# Patient Record
Sex: Male | Born: 1935 | Race: White | Hispanic: No | Marital: Single | Smoking: Former smoker
Health system: Southern US, Community
[De-identification: ages and names within clinical notes are randomized; demographics above are authoritative.]

## PROBLEM LIST (undated history)

## (undated) DIAGNOSIS — R456 Violent behavior: Secondary | ICD-10-CM

## (undated) DIAGNOSIS — I447 Left bundle-branch block, unspecified: Secondary | ICD-10-CM

## (undated) DIAGNOSIS — H05012 Cellulitis of left orbit: Secondary | ICD-10-CM

## (undated) DIAGNOSIS — I10 Essential (primary) hypertension: Secondary | ICD-10-CM

## (undated) DIAGNOSIS — J9311 Primary spontaneous pneumothorax: Secondary | ICD-10-CM

## (undated) HISTORY — PX: CYST EXCISION: SHX5701

## (undated) HISTORY — DX: Left bundle-branch block, unspecified: I44.7

---

## 2001-05-04 ENCOUNTER — Emergency Department (HOSPITAL_COMMUNITY): Admission: EM | Admit: 2001-05-04 | Discharge: 2001-05-04 | Payer: Self-pay | Admitting: *Deleted

## 2001-05-05 ENCOUNTER — Emergency Department (HOSPITAL_COMMUNITY): Admission: EM | Admit: 2001-05-05 | Discharge: 2001-05-05 | Payer: Self-pay | Admitting: Emergency Medicine

## 2009-10-04 ENCOUNTER — Ambulatory Visit (HOSPITAL_COMMUNITY): Admission: RE | Admit: 2009-10-04 | Discharge: 2009-10-04 | Payer: Self-pay

## 2010-10-07 ENCOUNTER — Other Ambulatory Visit (HOSPITAL_COMMUNITY): Payer: Self-pay | Admitting: Gastroenterology

## 2010-10-10 ENCOUNTER — Other Ambulatory Visit (HOSPITAL_COMMUNITY): Payer: Self-pay | Admitting: Gastroenterology

## 2010-10-10 DIAGNOSIS — J984 Other disorders of lung: Secondary | ICD-10-CM

## 2010-10-18 ENCOUNTER — Ambulatory Visit (HOSPITAL_COMMUNITY)
Admission: RE | Admit: 2010-10-18 | Discharge: 2010-10-18 | Disposition: A | Discharge status: Home / Self Care | Payer: Medicare Other | Source: Ambulatory Visit | Attending: Gastroenterology | Admitting: Gastroenterology

## 2010-10-18 DIAGNOSIS — J984 Other disorders of lung: Secondary | ICD-10-CM | POA: Insufficient documentation

## 2010-10-18 DIAGNOSIS — N200 Calculus of kidney: Secondary | ICD-10-CM | POA: Insufficient documentation

## 2012-12-17 ENCOUNTER — Ambulatory Visit (INDEPENDENT_AMBULATORY_CARE_PROVIDER_SITE_OTHER): Payer: Medicare Other | Admitting: Cardiovascular Disease

## 2012-12-17 ENCOUNTER — Encounter: Payer: Self-pay | Admitting: Cardiovascular Disease

## 2012-12-17 VITALS — BP 160/80 | HR 78 | Ht 67.0 in | Wt 138.8 lb

## 2012-12-17 DIAGNOSIS — I10 Essential (primary) hypertension: Secondary | ICD-10-CM

## 2012-12-17 DIAGNOSIS — I447 Left bundle-branch block, unspecified: Secondary | ICD-10-CM

## 2012-12-17 NOTE — Patient Instructions (Addendum)
You have been referred to primary care physician group.  Your physician has requested that you have an echocardiogram. Echocardiography is a painless test that uses sound waves to create images of your heart. It provides your doctor with information about the size and shape of your heart and how well your heart's chambers and valves are working. This procedure takes approximately one hour. There are no restrictions for this procedure.  Your physician wants you to follow-up in: 1 year or sooner if needed, You will receive a reminder letter in the mail two months in advance. If you don't receive a letter, please call our office to schedule the follow-up appointment.  REDUCE HIGH SODIUM FOODS LIKE CANNED SOUP, GRAVY, SAUCES, READY PREPARED FOODS LIKE FROZEN FOODS; LEAN CUISINE, LASAGNA. BACON, SAUSAGE, LUNCH MEAT, FAST FOODS, HOT DOGS, CHIPS, PIZZA.   DASH Diet The DASH diet stands for "Dietary Approaches to Stop Hypertension." It is a healthy eating plan that has been shown to reduce high blood pressure (hypertension) in as little as 14 days, while also possibly providing other significant health benefits. These other health benefits include reducing the risk of breast cancer after menopause and reducing the risk of type 2 diabetes, heart disease, colon cancer, and stroke. Health benefits also include weight loss and slowing kidney failure in patients with chronic kidney disease.  DIET GUIDELINES  Limit salt (sodium). Your diet should contain less than 1500 mg of sodium daily.  Limit refined or processed carbohydrates. Your diet should include mostly whole grains. Desserts and added sugars should be used sparingly.  Include small amounts of heart-healthy fats. These types of fats include nuts, oils, and tub margarine. Limit saturated and trans fats. These fats have been shown to be harmful in the body. CHOOSING FOODS  The following food groups are based on a 2000 calorie diet. See your Registered  Dietitian for individual calorie needs. Grains and Grain Products (6 to 8 servings daily)  Eat More Often: Whole-wheat bread, brown rice, whole-grain or wheat pasta, quinoa, popcorn without added fat or salt (air popped).  Eat Less Often: White bread, white pasta, white rice, cornbread. Vegetables (4 to 5 servings daily)  Eat More Often: Fresh, frozen, and canned vegetables. Vegetables may be raw, steamed, roasted, or grilled with a minimal amount of fat.  Eat Less Often/Avoid: Creamed or fried vegetables. Vegetables in a cheese sauce. Fruit (4 to 5 servings daily)  Eat More Often: All fresh, canned (in natural juice), or frozen fruits. Dried fruits without added sugar. One hundred percent fruit juice ( cup [237 mL] daily).  Eat Less Often: Dried fruits with added sugar. Canned fruit in light or heavy syrup. [employer], Fish, and Poultry (2 servings or less daily. One serving is 3 to 4 oz [85-114 g]).  Eat More Often: Ninety percent or leaner ground beef, tenderloin, sirloin. Round cuts of beef, chicken breast, [place] breast. All fish. Grill, bake, or broil your meat. Nothing should be fried.  Eat Less Often/Avoid: Fatty cuts of meat, [place], or chicken leg, thigh, or wing. Fried cuts of meat or fish. Dairy (2 to 3 servings)  Eat More Often: Low-fat or fat-free milk, low-fat plain or light yogurt, reduced-fat or part-skim cheese.  Eat Less Often/Avoid: Milk (whole, 2%).Whole milk yogurt. Full-fat cheeses. Nuts, Seeds, and Legumes (4 to 5 servings per week)  Eat More Often: All without added salt.  Eat Less Often/Avoid: Salted nuts and seeds, canned beans with added salt. Fats and Sweets (limited)  Eat More  Often: Vegetable oils, tub margarines without trans fats, sugar-free gelatin. Mayonnaise and salad dressings.  Eat Less Often/Avoid: Coconut oils, palm oils, butter, stick margarine, cream, half and half, cookies, candy, pie. FOR MORE INFORMATION The Dash Diet Eating Plan:  www.dashdiet.org Document Released: 04/27/2011 Document Revised: 07/31/2011 Document Reviewed: 04/27/2011 [facility] Patient Information 2014 [place], [place].

## 2012-12-17 NOTE — Progress Notes (Signed)
     Lawrence Hicks Date of Birth  04/03/1924       [facility]    [employer] [address], [address], [place], [place]  [address]   [place], [place]  [address] [phone]     [phone]   Fax  [phone]    Fax [phone]  Problem List: 1.  LBBB 2.  Hypertension   History of Present Illness:  [name] is an 79 yo with hx of LBBB and mild HTN.  He was advised by his medical doctor in [place] to have a stress test every 5 years.  He is here to get established and to have his stress test.    Denies any chest pain or dyscomfort.  He does his own shopping and housework.  He has no limitations with his activity.  He   Current Outpatient Prescriptions  Medication Sig Dispense Refill  . ramipril (ALTACE) 2.5 MG capsule 2.5 mg daily.       No current facility-administered medications for this visit.     No Known Allergies  Past Medical History  Diagnosis Date  . LBBB (left bundle branch block)     Past Surgical History  Procedure Laterality Date  . Cyst excision      79 yrs old    History  Smoking status  . Former Smoker  Smokeless tobacco  . Not on file    Comment: 50 yrs ago    History  Alcohol Use: Not on file    No family history on file.  Reviw of Systems:  Reviewed in the HPI.  All other systems are negative.  Physical Exam: Blood pressure 160/80, pulse 78, height 5\' 7"  (1.702 m), weight 138 lb 12.8 oz (62.959 kg). General: Well developed, well nourished, in no acute distress.  Head: Normocephalic, atraumatic, sclera non-icteric, mucus membranes are moist,   Neck: Supple. Carotids are 2 + without bruits. No JVD   Lungs: Clear   Heart: RR, normal S1, S2, reverse splitting of his S2. Abdomen: Soft, non-tender, non-distended with normal bowel sounds.  Msk:  Strength and tone are normal   Extremities: No clubbing or cyanosis. No edema.  Distal pedal pulses are 2+ and equal    Neuro: CN II -  XII intact.  Alert and oriented X 3.   Psych:  Normal   ECG: December 17, 2012:  NSR at 18.  LBBB. LAH.    Assessment / Plan:

## 2012-12-17 NOTE — Assessment & Plan Note (Addendum)
He is completely asymptomatic.   At this point, i would not recommend any further testing in this 79 yo gentleman with no symptoms.  He has no symptoms.  We will get an echo to ensure that his LV function is normal.    His BP is typically normal.  ijt is a bit high today ( running around and stressed today) He gets his ramapril filled by his medical doctor in [place].  I'll see him again in one year for followup office visit, EKG, and fasting labs. Will give him a referral to a primary care doctor here in [place] in case he needs primary care locally.  Scherrie Merritts, MD [address] [place]  [address] [phone] Fax - [phone]

## 2012-12-30 ENCOUNTER — Telehealth: Payer: Self-pay | Admitting: Cardiovascular Disease

## 2012-12-30 NOTE — Telephone Encounter (Signed)
Pt declines having an echo/ order cancelled. He feels he does not need. Tried to explain but pt declined.

## 2012-12-30 NOTE — Telephone Encounter (Signed)
New prob  Pt wants to know why he is having the echo. He said he does not have any heart problems and does not feel it is necessary.

## 2013-01-01 ENCOUNTER — Ambulatory Visit (HOSPITAL_COMMUNITY): Payer: Medicare Other | Attending: Cardiovascular Disease | Admitting: Radiology

## 2013-01-01 ENCOUNTER — Other Ambulatory Visit (HOSPITAL_COMMUNITY): Payer: Medicare Other

## 2013-01-01 DIAGNOSIS — I447 Left bundle-branch block, unspecified: Secondary | ICD-10-CM | POA: Insufficient documentation

## 2013-01-01 DIAGNOSIS — I10 Essential (primary) hypertension: Secondary | ICD-10-CM | POA: Insufficient documentation

## 2013-01-01 DIAGNOSIS — Z87891 Personal history of nicotine dependence: Secondary | ICD-10-CM | POA: Insufficient documentation

## 2013-01-01 NOTE — Progress Notes (Signed)
Echocardiogram performed.  

## 2013-01-02 ENCOUNTER — Telehealth: Payer: Self-pay | Admitting: Cardiovascular Disease

## 2013-01-02 NOTE — Telephone Encounter (Signed)
Echo reviewed.

## 2013-01-02 NOTE — Telephone Encounter (Signed)
F/UP  PT RTNED CALL ON RESULTS

## 2013-01-31 ENCOUNTER — Ambulatory Visit: Payer: Medicare Other | Admitting: Internal Medicine

## 2013-02-10 ENCOUNTER — Other Ambulatory Visit: Payer: Self-pay | Admitting: Rehabilitation

## 2013-02-10 DIAGNOSIS — M545 Low back pain, unspecified: Secondary | ICD-10-CM

## 2013-02-10 DIAGNOSIS — M479 Spondylosis, unspecified: Secondary | ICD-10-CM

## 2013-02-27 ENCOUNTER — Ambulatory Visit
Admission: RE | Admit: 2013-02-27 | Discharge: 2013-02-27 | Disposition: A | Discharge status: Home / Self Care | Payer: Medicare Other | Source: Ambulatory Visit | Attending: Rehabilitation | Admitting: Rehabilitation

## 2013-02-27 DIAGNOSIS — M545 Low back pain: Secondary | ICD-10-CM

## 2013-02-27 DIAGNOSIS — M479 Spondylosis, unspecified: Secondary | ICD-10-CM

## 2013-05-26 DIAGNOSIS — M4120 Other idiopathic scoliosis, site unspecified: Secondary | ICD-10-CM | POA: Diagnosis not present

## 2013-05-26 DIAGNOSIS — M545 Low back pain, unspecified: Secondary | ICD-10-CM | POA: Diagnosis not present

## 2013-05-26 DIAGNOSIS — M47817 Spondylosis without myelopathy or radiculopathy, lumbosacral region: Secondary | ICD-10-CM | POA: Diagnosis not present

## 2013-08-27 DIAGNOSIS — M545 Low back pain, unspecified: Secondary | ICD-10-CM | POA: Diagnosis not present

## 2013-08-27 DIAGNOSIS — M4120 Other idiopathic scoliosis, site unspecified: Secondary | ICD-10-CM | POA: Diagnosis not present

## 2013-08-27 DIAGNOSIS — M48061 Spinal stenosis, lumbar region without neurogenic claudication: Secondary | ICD-10-CM | POA: Diagnosis not present

## 2013-11-25 DIAGNOSIS — H30149 Acute posterior multifocal placoid pigment epitheliopathy, unspecified eye: Secondary | ICD-10-CM | POA: Diagnosis not present

## 2013-11-25 DIAGNOSIS — Z961 Presence of intraocular lens: Secondary | ICD-10-CM | POA: Diagnosis not present

## 2013-11-25 DIAGNOSIS — H18419 Arcus senilis, unspecified eye: Secondary | ICD-10-CM | POA: Diagnosis not present

## 2013-11-25 DIAGNOSIS — I10 Essential (primary) hypertension: Secondary | ICD-10-CM | POA: Diagnosis not present

## 2013-12-17 ENCOUNTER — Ambulatory Visit (INDEPENDENT_AMBULATORY_CARE_PROVIDER_SITE_OTHER): Payer: Medicare Other | Admitting: Cardiovascular Disease

## 2013-12-17 ENCOUNTER — Encounter: Payer: Self-pay | Admitting: Cardiovascular Disease

## 2013-12-17 VITALS — BP 130/70 | HR 77 | Ht 67.0 in | Wt 135.0 lb

## 2013-12-17 DIAGNOSIS — I10 Essential (primary) hypertension: Secondary | ICD-10-CM

## 2013-12-17 DIAGNOSIS — I447 Left bundle-branch block, unspecified: Secondary | ICD-10-CM

## 2013-12-17 NOTE — Progress Notes (Signed)
     Lawrence Hicks Date of Birth  1923-12-20       [facility]    [employer] [address], [place], [place]  [address]   [place], [place]  [address] [phone]     [phone]   Fax  [phone]    Fax [phone]  Problem List: 1.  LBBB 2.  Hypertension   History of Present Illness:  Lawrence Hicks is an 80 yo with hx of LBBB and mild HTN.  He was advised by his medical doctor in [place] to have a stress test every 5 years.  He is here to get established and to have his stress test.    Denies any chest pain or dyscomfort.  He does his own shopping and housework.  He has no limitations with his activity.  He   December 17, 2013:  Lawrence Hicks is doing ok.  No CP or dyspnea.   He needs a primary care MD    Current Outpatient Prescriptions  Medication Sig Dispense Refill  . ramipril (ALTACE) 2.5 MG capsule 2.5 mg daily.       No current facility-administered medications for this visit.     No Known Allergies  Past Medical History  Diagnosis Date  . LBBB (left bundle branch block)     Past Surgical History  Procedure Laterality Date  . Cyst excision      80 yrs old    History  Smoking status  . Former Smoker  Smokeless tobacco  . Not on file    Comment: 50 yrs ago    History  Alcohol Use: Not on file    No family history on file.  Reviw of Systems:  Reviewed in the HPI.  All other systems are negative.  Physical Exam: Blood pressure 130/70, pulse 77, height 5\' 7"  (1.702 m), weight 135 lb (61.236 kg). General: Well developed, well nourished, in no acute distress.  Head: Normocephalic, atraumatic, sclera non-icteric, mucus membranes are moist,   Neck: Supple. Carotids are 2 + without bruits. No JVD   Lungs: Clear   Heart: RR, normal S1, S2, reverse splitting of his S2. Abdomen: Soft, non-tender, non-distended with normal bowel sounds.  Msk:  Strength and tone are normal   Extremities: No clubbing  or cyanosis. No edema.  Distal pedal pulses are 2+ and equal    Neuro: CN II - XII intact.  Alert and oriented X 3.   Psych:  Normal   ECG: December 17, 2013:  NSR at 66.  LBBB. LAH.    Assessment / Plan:

## 2013-12-17 NOTE — Patient Instructions (Addendum)
Primary medical doctors  [facility] Internal medicine  [place]  Rachell Cipro, MD   [facility]   Your physician recommends that you continue on your current medications as directed. Please refer to the Current Medication list given to you today.  Your physician wants you to follow-up in: 1 year with Dr. Acie Fredrickson.  You will receive a reminder letter in the mail two months in advance. If you don't receive a letter, please call our office to schedule the follow-up appointment.

## 2013-12-17 NOTE — Assessment & Plan Note (Signed)
Stable

## 2013-12-17 NOTE — Assessment & Plan Note (Signed)
His blood pressures are low controlled. Continue with his same medications. I will see him  again in one year. He needs to find a general medical Dr.

## 2014-02-28 DIAGNOSIS — Z23 Encounter for immunization: Secondary | ICD-10-CM | POA: Diagnosis not present

## 2014-09-29 DIAGNOSIS — H3531 Nonexudative age-related macular degeneration: Secondary | ICD-10-CM | POA: Diagnosis not present

## 2014-09-29 DIAGNOSIS — H04122 Dry eye syndrome of left lacrimal gland: Secondary | ICD-10-CM | POA: Diagnosis not present

## 2014-09-29 DIAGNOSIS — Z961 Presence of intraocular lens: Secondary | ICD-10-CM | POA: Diagnosis not present

## 2014-09-29 DIAGNOSIS — H02401 Unspecified ptosis of right eyelid: Secondary | ICD-10-CM | POA: Diagnosis not present

## 2014-09-29 DIAGNOSIS — I10 Essential (primary) hypertension: Secondary | ICD-10-CM | POA: Diagnosis not present

## 2015-01-21 ENCOUNTER — Ambulatory Visit (INDEPENDENT_AMBULATORY_CARE_PROVIDER_SITE_OTHER): Payer: Medicare Other | Admitting: Cardiovascular Disease

## 2015-01-21 ENCOUNTER — Encounter: Payer: Self-pay | Admitting: Cardiovascular Disease

## 2015-01-21 VITALS — BP 164/100 | HR 101 | Ht 67.0 in | Wt 140.8 lb

## 2015-01-21 DIAGNOSIS — I10 Essential (primary) hypertension: Secondary | ICD-10-CM

## 2015-01-21 DIAGNOSIS — I447 Left bundle-branch block, unspecified: Secondary | ICD-10-CM

## 2015-01-21 MED ORDER — CARVEDILOL 6.25 MG PO TABS: 6.2500 mg | ORAL_TABLET | Freq: Two times a day (BID) | ORAL | Status: DC

## 2015-01-21 MED ORDER — RAMIPRIL 2.5 MG PO CAPS: 2.5000 mg | ORAL_CAPSULE | Freq: Every day | ORAL | Status: DC
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
## 2015-01-21 NOTE — Patient Instructions (Addendum)
Medication Instructions:  START Coreg (Carvedilol) 6.25 mg twice daily - take 12 hours apart   Labwork: None Ordered   Testing/Procedures: None Ordered   Follow-Up: Your physician recommends that you follow-up with a nurse visit/BP Check in 6 weeks - return on Thursday October 13 at 11:00   Your physician wants you to follow-up in: 6 months with Dr. Acie Fredrickson.  You will receive a reminder letter in the mail two months in advance. If you don't receive a letter, please call our office to schedule the follow-up appointment.

## 2015-01-27 ENCOUNTER — Telehealth: Payer: Self-pay | Admitting: Cardiovascular Disease

## 2015-01-27 NOTE — Telephone Encounter (Signed)
New message     Pt c/o medication issue:  1. Name of Medication: Coreo/carvedilol  2. How are you currently taking this medication (dosage and times per day)? 6.25mg  2 times a day  3. Are you having a reaction (difficulty breathing--STAT)? No  4. What is your medication issue? Nausea; very weak; vision is not as sharp

## 2015-01-27 NOTE — Telephone Encounter (Signed)
Left message for patient to call me back. 

## 2015-01-27 NOTE — Telephone Encounter (Signed)
Attempted to contact patient again; no answer Left message to call the office

## 2015-01-28 ENCOUNTER — Telehealth: Payer: Self-pay

## 2015-01-28 NOTE — Telephone Encounter (Signed)
Patient is waiting on a call back. He said he will wait before going to go food with his neighbor. He is dizzy and weak.

## 2015-01-28 NOTE — Telephone Encounter (Signed)
Attempted to call patient, phone rang numerous times - no answer.  Will call back in a few mintues

## 2015-01-28 NOTE — Telephone Encounter (Signed)
Spoke with patient.  He states he has been feeling weak and dizzy since starting Carvedilol a few weeks ago.  I discussed the dizziness with him and advised that we can cut the dose in half as the dizziness is especially concerning with his advanced age and due to the fact that he lives alone.  The patient states the symptom that is most concerning to him is that he feels weak; he states he does not notice that he is "swimmy-headed."  He just started the carvedilol yesterday.  I advised him that it will take him 1-2 weeks to adjust to the effects of the beta blocker and that weakness is a known side effect.  I asked him again about the dizziness and he says that he gets around his home fine with the help of his walking stick and does not feel like he is going to fall down.  He states he does not go out of the house without assistance.  He states he would like to stay on the medication a little longer to see how he feels.  I advised him that he may continue but to call me if the dizziness worsens and that we can cut the medication dose in half.  I advised that if he does not have dizziness to call me in 1 week to report how he is feeling.

## 2015-01-29 ENCOUNTER — Telehealth: Payer: Self-pay | Admitting: Nurse Practitioner

## 2015-01-29 NOTE — Telephone Encounter (Signed)
Agree with the plan to cut back on the Coreg  Will touch base with him next week

## 2015-01-29 NOTE — Telephone Encounter (Signed)
Spoke with patient who states he did not feel well last night or this morning after taking Carvedilol 6.25 mg.  I advised patient to cut dose in 1/2 and take 3.125 mg twice daily.  He states he has someone taking him grocery shopping tomorrow and he would like to start the lower dose tomorrow night.  He will hold Carvedilol tonight and tomorrow morning due to feeling "wobbly."  I advised him that he may hold medication until tomorrow night and then start 3.125 mg dose.  I advised him to call me next week to let me know how he is feeling.  He verified that he will continue Ramipril 2.5 mg and he verbalized understanding and agreement with plan of care.

## 2015-02-04 ENCOUNTER — Telehealth: Payer: Self-pay | Admitting: Nurse Practitioner

## 2015-02-04 NOTE — Telephone Encounter (Signed)
Agree with medication changes and the note by Christen Bame, RN

## 2015-02-04 NOTE — Telephone Encounter (Signed)
Attempted to call patient to check on him since decreasing his dose of Carvedilol on 9/9.  I left a message for patient to call me back

## 2015-02-04 NOTE — Telephone Encounter (Signed)
Spoke with patient who states he has stopped taking the Carvedilol due to "wobbly legs" and inability to ambulate well even with his cane.  He states he thinks the medicine is too strong for him.  He is scheduled for BP check/nurse visit on 10/13.  I advised him to continue to hold medication until that visit.  He verbalized understanding and agreement.

## 2015-03-04 ENCOUNTER — Ambulatory Visit (INDEPENDENT_AMBULATORY_CARE_PROVIDER_SITE_OTHER): Payer: Medicare Other | Admitting: *Deleted

## 2015-03-04 VITALS — BP 138/74 | HR 62 | Wt 140.0 lb

## 2015-03-04 DIAGNOSIS — I10 Essential (primary) hypertension: Secondary | ICD-10-CM

## 2015-03-04 NOTE — Progress Notes (Signed)
1.) Reason for visit: BP Check  2.) Name of MD requesting visit: Dr. Acie Fredrickson  3.) ROS related to problem: Pt in today for BP check.  Had pt rest for 10 minutes after walking from lobby.  BP 138/74, HR 62.  Pt denies dizziness, lightheadedness, CP, headache, N/V or SOB.  Pt states that when on the Coreg he had an episode of blurry vision and a "wobbly" feeling.  Pt states that he has not taken Coreg at all since [place] told him to stop it and he has felt fine.    4.) Assessment and plan per MD: Advised pt that I will send information to Dr. Acie Fredrickson and his nurse and someone from our office will call him with any recommendations from Dr. Acie Fredrickson.  Pt verbalized understanding and was very appreciative for visit today.

## 2015-03-08 NOTE — Progress Notes (Signed)
Agree with holding Coreg since he was having symptoms while on the Coreg and the unsteadiness feeling has resolved now that he has DC'd the coreg.

## 2015-03-14 DIAGNOSIS — Z23 Encounter for immunization: Secondary | ICD-10-CM | POA: Diagnosis not present

## 2015-07-20 ENCOUNTER — Encounter: Payer: Self-pay | Admitting: Cardiovascular Disease

## 2015-07-20 ENCOUNTER — Ambulatory Visit (INDEPENDENT_AMBULATORY_CARE_PROVIDER_SITE_OTHER): Payer: Medicare Other | Admitting: Cardiovascular Disease

## 2015-07-20 VITALS — BP 130/75 | HR 65 | Ht 67.0 in | Wt 141.2 lb

## 2015-07-20 DIAGNOSIS — Z13220 Encounter for screening for lipoid disorders: Secondary | ICD-10-CM

## 2015-07-20 DIAGNOSIS — I10 Essential (primary) hypertension: Secondary | ICD-10-CM | POA: Diagnosis not present

## 2015-07-20 LAB — CBC WITH DIFFERENTIAL/PLATELET
BASOS ABS: 0 10*3/uL (ref 0.0–0.1)
Basophils Relative: 0 % (ref 0–1)
EOS ABS: 0 10*3/uL (ref 0.0–0.7)
Eosinophils Relative: 0 % (ref 0–5)
HCT: 40.6 % (ref 39.0–52.0)
Hemoglobin: 13.8 g/dL (ref 13.0–17.0)
LYMPHS ABS: 1.5 10*3/uL (ref 0.7–4.0)
LYMPHS PCT: 15 % (ref 12–46)
MCH: 30.4 pg (ref 26.0–34.0)
MCHC: 34 g/dL (ref 30.0–36.0)
MCV: 89.4 fL (ref 78.0–100.0)
MPV: 11.2 fL (ref 8.6–12.4)
Monocytes Absolute: 0.8 10*3/uL (ref 0.1–1.0)
Monocytes Relative: 8 % (ref 3–12)
NEUTROS PCT: 77 % (ref 43–77)
Neutro Abs: 7.5 10*3/uL (ref 1.7–7.7)
PLATELETS: 234 10*3/uL (ref 150–400)
RBC: 4.54 MIL/uL (ref 4.22–5.81)
RDW: 13.8 % (ref 11.5–15.5)
WBC: 9.7 10*3/uL (ref 4.0–10.5)

## 2015-07-20 MED ORDER — RAMIPRIL 2.5 MG PO CAPS: 2.5000 mg | ORAL_CAPSULE | Freq: Every day | ORAL | Status: DC

## 2015-07-20 NOTE — Patient Instructions (Addendum)
Medication Instructions:  Your physician recommends that you continue on your current medications as directed. Please refer to the Current Medication list given to you today.   Labwork: TODAY - CBC, complete metabolic panel, cholesterol   Testing/Procedures: None Ordered   Follow-Up: Your physician wants you to follow-up in: 1 year with Dr. Acie Fredrickson.  You will receive a reminder letter in the mail two months in advance. If you don't receive a letter, please call our office to schedule the follow-up appointment.   If you need a refill on your cardiac medications before your next appointment, please call your pharmacy.   Thank you for choosing [facility]! Christen Bame, RN [phone]
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
## 2015-07-21 LAB — COMPREHENSIVE METABOLIC PANEL
ALK PHOS: 105 U/L (ref 40–115)
ALT: 12 U/L (ref 9–46)
AST: 17 U/L (ref 10–35)
Albumin: 4.2 g/dL (ref 3.6–5.1)
BILIRUBIN TOTAL: 0.5 mg/dL (ref 0.2–1.2)
BUN: 25 mg/dL (ref 7–25)
CO2: 23 mmol/L (ref 20–31)
CREATININE: 0.98 mg/dL (ref 0.70–1.11)
Calcium: 9.5 mg/dL (ref 8.6–10.3)
Chloride: 102 mmol/L (ref 98–110)
Glucose, Bld: 104 mg/dL — ABNORMAL HIGH (ref 65–99)
POTASSIUM: 4.6 mmol/L (ref 3.5–5.3)
Sodium: 139 mmol/L (ref 135–146)
TOTAL PROTEIN: 6.8 g/dL (ref 6.1–8.1)

## 2015-07-21 LAB — LIPID PANEL
CHOLESTEROL: 157 mg/dL (ref 125–200)
HDL: 61 mg/dL (ref >=40)
LDL Cholesterol: 77 mg/dL (ref <130)
Total CHOL/HDL Ratio: 2.6 Ratio (ref <=5.0)
Triglycerides: 93 mg/dL (ref <150)
VLDL: 19 mg/dL (ref <30)

## 2015-07-24 ENCOUNTER — Inpatient Hospital Stay (HOSPITAL_COMMUNITY)
Admission: EM | Admit: 2015-07-24 | Discharge: 2015-07-27 | DRG: 200 | Disposition: A | Discharge status: Home / Self Care | Payer: Medicare Other | Attending: Internal Medicine | Admitting: Internal Medicine

## 2015-07-24 ENCOUNTER — Inpatient Hospital Stay (HOSPITAL_COMMUNITY): Payer: Medicare Other

## 2015-07-24 ENCOUNTER — Emergency Department (HOSPITAL_COMMUNITY): Payer: Medicare Other

## 2015-07-24 DIAGNOSIS — R05 Cough: Secondary | ICD-10-CM

## 2015-07-24 DIAGNOSIS — M549 Dorsalgia, unspecified: Secondary | ICD-10-CM | POA: Diagnosis not present

## 2015-07-24 DIAGNOSIS — R410 Disorientation, unspecified: Secondary | ICD-10-CM | POA: Diagnosis not present

## 2015-07-24 DIAGNOSIS — I447 Left bundle-branch block, unspecified: Secondary | ICD-10-CM | POA: Diagnosis present

## 2015-07-24 DIAGNOSIS — Z87891 Personal history of nicotine dependence: Secondary | ICD-10-CM | POA: Diagnosis not present

## 2015-07-24 DIAGNOSIS — J9819 Other pulmonary collapse: Secondary | ICD-10-CM | POA: Diagnosis present

## 2015-07-24 DIAGNOSIS — Z79899 Other long term (current) drug therapy: Secondary | ICD-10-CM

## 2015-07-24 DIAGNOSIS — I10 Essential (primary) hypertension: Secondary | ICD-10-CM | POA: Diagnosis not present

## 2015-07-24 DIAGNOSIS — R069 Unspecified abnormalities of breathing: Secondary | ICD-10-CM | POA: Diagnosis not present

## 2015-07-24 DIAGNOSIS — Z9689 Presence of other specified functional implants: Secondary | ICD-10-CM | POA: Insufficient documentation

## 2015-07-24 DIAGNOSIS — R0902 Hypoxemia: Secondary | ICD-10-CM | POA: Insufficient documentation

## 2015-07-24 DIAGNOSIS — J069 Acute upper respiratory infection, unspecified: Secondary | ICD-10-CM | POA: Insufficient documentation

## 2015-07-24 DIAGNOSIS — R059 Cough, unspecified: Secondary | ICD-10-CM | POA: Insufficient documentation

## 2015-07-24 DIAGNOSIS — J939 Pneumothorax, unspecified: Secondary | ICD-10-CM | POA: Insufficient documentation

## 2015-07-24 DIAGNOSIS — Z4682 Encounter for fitting and adjustment of non-vascular catheter: Secondary | ICD-10-CM | POA: Diagnosis not present

## 2015-07-24 DIAGNOSIS — J9383 Other pneumothorax: Secondary | ICD-10-CM | POA: Diagnosis not present

## 2015-07-24 DIAGNOSIS — Z789 Other specified health status: Secondary | ICD-10-CM | POA: Diagnosis not present

## 2015-07-24 DIAGNOSIS — Z8679 Personal history of other diseases of the circulatory system: Secondary | ICD-10-CM | POA: Diagnosis not present

## 2015-07-24 DIAGNOSIS — R0602 Shortness of breath: Secondary | ICD-10-CM | POA: Diagnosis not present

## 2015-07-24 DIAGNOSIS — J9811 Atelectasis: Secondary | ICD-10-CM | POA: Diagnosis not present

## 2015-07-24 LAB — CBC
HCT: 35.6 % — ABNORMAL LOW (ref 39.0–52.0)
HEMATOCRIT: 39.4 % (ref 39.0–52.0)
HEMOGLOBIN: 13.4 g/dL (ref 13.0–17.0)
Hemoglobin: 11.6 g/dL — ABNORMAL LOW (ref 13.0–17.0)
MCH: 31.1 pg (ref 26.0–34.0)
MCH: 29.8 pg (ref 26.0–34.0)
MCHC: 34 g/dL (ref 30.0–36.0)
MCHC: 32.6 g/dL (ref 30.0–36.0)
MCV: 91.4 fL (ref 78.0–100.0)
MCV: 91.5 fL (ref 78.0–100.0)
PLATELETS: 180 10*3/uL (ref 150–400)
Platelets: 181 10*3/uL (ref 150–400)
RBC: 4.31 MIL/uL (ref 4.22–5.81)
RBC: 3.89 MIL/uL — AB (ref 4.22–5.81)
RDW: 14.1 % (ref 11.5–15.5)
RDW: 14 % (ref 11.5–15.5)
WBC: 6.6 10*3/uL (ref 4.0–10.5)
WBC: 9.2 10*3/uL (ref 4.0–10.5)

## 2015-07-24 LAB — I-STAT CG4 LACTIC ACID, ED: Lactic Acid, Venous: 1.58 mmol/L (ref 0.5–2.0)

## 2015-07-24 LAB — BASIC METABOLIC PANEL
Anion gap: 12 (ref 5–15)
BUN: 27 mg/dL — AB (ref 6–20)
CALCIUM: 9 mg/dL (ref 8.9–10.3)
CHLORIDE: 107 mmol/L (ref 101–111)
CO2: 21 mmol/L — AB (ref 22–32)
CREATININE: 1.07 mg/dL (ref 0.61–1.24)
GFR calc Af Amer: >60 mL/min (ref >60)
GFR calc non Af Amer: 59 mL/min — ABNORMAL LOW (ref >60)
Glucose, Bld: 108 mg/dL — ABNORMAL HIGH (ref 65–99)
Potassium: 4.3 mmol/L (ref 3.5–5.1)
Sodium: 140 mmol/L (ref 135–145)

## 2015-07-24 LAB — I-STAT TROPONIN, ED: TROPONIN I, POC: 0 ng/mL (ref 0.00–0.08)

## 2015-07-24 LAB — BRAIN NATRIURETIC PEPTIDE: B Natriuretic Peptide: 26.3 pg/mL (ref 0.0–100.0)

## 2015-07-24 LAB — INFLUENZA PANEL BY PCR (TYPE A & B)
H1N1 flu by pcr: NOT DETECTED
Influenza A By PCR: NEGATIVE
Influenza B By PCR: NEGATIVE

## 2015-07-24 MED ORDER — ACETAMINOPHEN 325 MG PO TABS
650.0000 mg | ORAL_TABLET | Freq: Four times a day (QID) | ORAL | Status: DC | PRN
Start: 2015-07-24 — End: 2015-07-27
  Administered 2015-07-24: 650 mg via ORAL
  Filled 2015-07-24: qty 2

## 2015-07-24 MED ORDER — FLUTICASONE PROPIONATE 50 MCG/ACT NA SUSP: 2.0000 | Freq: Every day | NASAL | Status: DC

## 2015-07-24 MED ORDER — CROMOLYN SODIUM 5.2 MG/ACT NA AERS
1.0000 | INHALATION_SPRAY | Freq: Four times a day (QID) | NASAL | Status: DC
  Filled 2015-07-24: qty 13

## 2015-07-24 MED ORDER — DEXTROMETHORPHAN POLISTIREX ER 30 MG/5ML PO SUER
30.0000 mg | Freq: Two times a day (BID) | ORAL | Status: DC | PRN
  Filled 2015-07-24: qty 5

## 2015-07-24 MED ORDER — POLYETHYLENE GLYCOL 3350 17 G PO PACK: 17.0000 g | PACK | Freq: Every day | ORAL | Status: DC | PRN

## 2015-07-24 MED ORDER — HYDROCODONE-ACETAMINOPHEN 5-325 MG PO TABS: 1.0000 | ORAL_TABLET | ORAL | Status: DC | PRN

## 2015-07-24 MED ORDER — HYDROMORPHONE HCL 1 MG/ML IJ SOLN: 0.2500 mg | INTRAMUSCULAR | Status: DC | PRN

## 2015-07-24 MED ORDER — FLUTICASONE PROPIONATE 50 MCG/ACT NA SUSP
2.0000 | Freq: Every day | NASAL | Status: DC
  Administered 2015-07-24 – 2015-07-27 (×4): 2 via NASAL
  Filled 2015-07-24: qty 16

## 2015-07-24 MED ORDER — LIDOCAINE-EPINEPHRINE (PF) 2 %-1:200000 IJ SOLN
20.0000 mL | Freq: Once | INTRAMUSCULAR | Status: AC
  Administered 2015-07-24: 20 mL
  Filled 2015-07-24: qty 20

## 2015-07-24 MED ORDER — SODIUM CHLORIDE 0.9% FLUSH
3.0000 mL | Freq: Two times a day (BID) | INTRAVENOUS | Status: DC
Start: 2015-07-24 — End: 2015-07-27
  Administered 2015-07-24 – 2015-07-26 (×6): 3 mL via INTRAVENOUS

## 2015-07-24 MED ORDER — ENOXAPARIN SODIUM 40 MG/0.4ML ~~LOC~~ SOLN
40.0000 mg | SUBCUTANEOUS | Status: DC
Start: 2015-07-24 — End: 2015-07-24
  Administered 2015-07-24: 40 mg via SUBCUTANEOUS
  Filled 2015-07-24: qty 0.4

## 2015-07-24 MED ORDER — ACETAMINOPHEN 650 MG RE SUPP: 650.0000 mg | Freq: Four times a day (QID) | RECTAL | Status: DC | PRN

## 2015-07-24 NOTE — ED Provider Notes (Addendum)
CSN: [id number]     Arrival date & time 07/24/15  0503 History   First MD Initiated Contact with Patient 07/24/15 [phone]     Chief Complaint  Patient presents with  . Shortness of Breath     (Consider location/radiation/quality/duration/timing/severity/associated sxs/prior Treatment) Patient is a 82 y.o. male presenting with shortness of breath. The history is provided by the patient and the EMS personnel.  Shortness of Breath Associated symptoms: cough   Associated symptoms: no abdominal pain, no chest pain, no fever, no headaches, no neck pain, no rash, no sore throat and no vomiting   Patient w hx htn, present with sob. Pt also notes coughing for the past 2 weeks. Nasal congestion. No fever or chills. Sob onset in past 1-2 weeks. Worse tonight. Moderate-severe. Worse w coughing spells.  +nasal congestion. No sore throat. No known ill contacts. Denies recent abx use.      Past Medical History  Diagnosis Date  . LBBB (left bundle branch block)    Past Surgical History  Procedure Laterality Date  . Cyst excision      82 yrs old   Family History  Problem Relation Age of Onset  . Family history unknown: Yes   Social History  Substance Use Topics  . Smoking status: Former Research scientist (life sciences)  . Smokeless tobacco: Not on file     Comment: 50 yrs ago  . Alcohol Use: Not on file    Review of Systems  Constitutional: Negative for fever and chills.  HENT: Negative for sore throat.   Eyes: Negative for discharge and redness.  Respiratory: Positive for cough and shortness of breath.   Cardiovascular: Negative for chest pain and leg swelling.  Gastrointestinal: Negative for vomiting, abdominal pain and diarrhea.  Endocrine: Negative for polyuria.  Genitourinary: Negative for dysuria and flank pain.  Musculoskeletal: Negative for back pain and neck pain.  Skin: Negative for rash.  Neurological: Negative for headaches.  Hematological: Does not bruise/bleed easily.  Psychiatric/Behavioral:  Negative for confusion.      Allergies  Coreg  Home Medications   Prior to Admission medications   Medication Sig Start Date End Date Taking? Authorizing Provider  carvedilol (COREG) 6.25 MG tablet Take 1 tablet (6.25 mg total) by mouth 2 (two) times daily. Patient not taking: Reported on 03/04/2015 01/21/15   Thayer Headings, MD  ramipril (ALTACE) 2.5 MG capsule Take 1 capsule (2.5 mg total) by mouth daily. 07/20/15   Thayer Headings, MD   BP 164/76 mmHg  Pulse 84  Temp(Src) 97.5 F (36.4 C) (Oral)  Resp 27  Ht 5\' 7"  (1.702 m)  Wt 68.04 kg  BMI 23.49 kg/m2 Physical Exam  Constitutional: He is oriented to person, place, and time. He appears well-developed and well-nourished. No distress.  HENT:  Head: Atraumatic.  Mouth/Throat: Oropharynx is clear and moist.  Eyes: Conjunctivae are normal. No scleral icterus.  Neck: Neck supple. No tracheal deviation present.  No stiffness or rigidity  Cardiovascular: Normal rate, regular rhythm, normal heart sounds and intact distal pulses.  Exam reveals no gallop and no friction rub.   No murmur heard. Pulmonary/Chest: Effort normal. No accessory muscle usage. No respiratory distress.  Coughing. Decreased bs left. Trach midline. Upper resp congestion.   Abdominal: Soft. Bowel sounds are normal. He exhibits no distension. There is no tenderness.  Musculoskeletal: Normal range of motion. He exhibits no edema or tenderness.  Neurological: He is alert and oriented to person, place, and time.  Skin: Skin is warm  and dry. No rash noted. He is not diaphoretic.  Psychiatric: He has a normal mood and affect.  Nursing note and vitals reviewed.   ED Course  CHEST TUBE INSERTION Date/Time: 07/24/2015 7:19 AM Performed by: Lajean Saver Authorized by: Lajean Saver Consent: The procedure was performed in an emergent situation. Verbal consent obtained. Written consent obtained. Risks and benefits: risks, benefits and alternatives were  discussed Consent given by: patient Patient understanding: patient states understanding of the procedure being performed Patient consent: the patient's understanding of the procedure matches consent given Procedure consent: procedure consent matches procedure scheduled Relevant documents: relevant documents present and verified Test results: test results available and properly labeled Site marked: the operative site was marked Imaging studies: imaging studies available Patient identity confirmed: verbally with patient and arm band Time out: Immediately prior to procedure a "time out" was called to verify the correct patient, procedure, equipment, support staff and site/side marked as required. Indications: pneumothorax Patient sedated: no Anesthesia: local infiltration Local anesthetic: lidocaine 2% with epinephrine Anesthetic total: 12 ml Preparation: skin prepped with Betadine, skin prepped with ChloraPrep and sterile field established Placement location: left lateral Scalpel size: 11 Tube size: 20 [place] Dissection instrument: Kelly clamp Tube connected to: suction Suture material: 0 silk Dressing: Xeroform gauze Post-insertion x-ray findings: tube in good position Patient tolerance: Patient tolerated the procedure well with no immediate complications   (including critical care time) Labs Review  Results for orders placed or performed during the hospital encounter of [id number]  Basic metabolic panel  Result Value Ref Range   Sodium 140 135 - 145 mmol/L   Potassium 4.3 3.5 - 5.1 mmol/L   Chloride 107 101 - 111 mmol/L   CO2 21 (L) 22 - 32 mmol/L   Glucose, Bld 108 (H) 65 - 99 mg/dL   BUN 27 (H) 6 - 20 mg/dL   Creatinine, Ser 1.07 0.61 - 1.24 mg/dL   Calcium 9.0 8.9 - 10.3 mg/dL   GFR calc non Af Amer 59 (L) >60 mL/min   GFR calc Af Amer >60 >60 mL/min   Anion gap 12 5 - 15  CBC  Result Value Ref Range   WBC 6.6 4.0 - 10.5 K/uL   RBC 4.31 4.22 - 5.81 MIL/uL   Hemoglobin  13.4 13.0 - 17.0 g/dL   HCT 39.4 39.0 - 52.0 %   MCV 91.4 78.0 - 100.0 fL   MCH 31.1 26.0 - 34.0 pg   MCHC 34.0 30.0 - 36.0 g/dL   RDW 14.1 11.5 - 15.5 %   Platelets 181 150 - 400 K/uL  I-stat troponin, ED (not at [facility])  Result Value Ref Range   Troponin i, poc 0.00 0.00 - 0.08 ng/mL   Comment 3          I-Stat CG4 Lactic Acid, ED  Result Value Ref Range   Lactic Acid, Venous 1.58 0.5 - 2.0 mmol/L   Dg Chest 2 View  07/24/2015  CLINICAL DATA:  Acute onset of left upper back pain and shortness of breath. Initial encounter. EXAM: CHEST  2 VIEW COMPARISON:  CT of the chest performed 10/18/2010 FINDINGS: There is complete collapse of the left lung, with a left-sided pneumothorax involving the entire left hemithorax. No significant mediastinal shift is seen. Mild right basilar atelectasis is noted. No significant pleural effusion is identified. The cardiomediastinal silhouette is normal in size. No acute osseous abnormalities are seen. Degenerative change is noted at the left glenohumeral joint, with chronic remodeling humeral  head. IMPRESSION: Complete collapse of the left lung, with a left-sided pneumothorax involving the entire left hemithorax. Mild right basilar atelectasis noted. Critical Value/emergent results were called by telephone at the time of interpretation on 07/24/2015 at 6:10 am to Dr. Lajean Saver, who verbally acknowledged these results. Electronically Signed   By: Garald Balding M.D.   On: 07/24/2015 06:12      I have personally reviewed and evaluated these images and lab results as part of my medical decision-making.   EKG Interpretation   Date/Time:  Saturday July 24 2015 05:05:59 EST Ventricular Rate:  77 PR Interval:  196 QRS Duration: 140 QT Interval:  440 QTC Calculation: 497 R Axis:   -101 Text Interpretation:  Normal sinus rhythm Left axis deviation Left bundle  branch block No significant change since last tracing Confirmed by Hipolito Martinezlopez   MD, Christofer Shen ([id number]) on  07/24/2015 5:26:23 AM      MDM   Iv ns. Continuous pulse ox and monitor.   Cxr. Labs. Ecg.   Initially pt very hypoxic per EMS, o2 sats 70's, placed on 100% mask.   Pt continues with labored breathing. Dyspneic. Coughing, non prod.  Reviewed nursing notes and prior charts for additional history.   Large ptx on cxr. Will place chest tube - see note above.   Patients resp status improved post chest tube insertion.  Given recurrent cough, nasal congestion, subj fever, will add flu screen to workup.  CRITICAL CARE  RE acute respiratory distress, severe hypoxia, large/complete left pneumothorax,  Performed by: Mirna Mires Total critical care time: 35 minutes Critical care time was exclusive of separately billable procedures and treating other patients. Critical care was necessary to treat or prevent imminent or life-threatening deterioration. Critical care was time spent personally by me on the following activities: development of treatment plan with patient and/or surrogate as well as nursing, discussions with consultants, evaluation of patient's response to treatment, examination of patient, obtaining history from patient or surrogate, ordering and performing treatments and interventions, ordering and review of laboratory studies, ordering and review of radiographic studies, pulse oximetry and re-evaluation of patient's condition.  Pt indicates has no pcp - internal medicine resident paged, discussed pt, as an unassigned admission.  They will admit, requests pulm cnosult re management chest tube.  pulm consulted.      Lajean Saver, MD 07/24/15 [phone]

## 2015-07-24 NOTE — Consult Note (Signed)
Name: Lawrence Hicks MRN: [id number] DOB: Aug 02, 1923    ADMISSION DATE:  07/24/2015 CONSULTATION DATE:  3/4  REFERRING MD :  IMTS   CHIEF COMPLAINT:  ptx   BRIEF PATIENT DESCRIPTION: 82yo male with hx HTN, LBBB presented 3/4 with 2 week hx cough and congestion and new onset L sided back pain and SOB which woke him at 3am.  He was hypoxic on admit with sats 70's on RA.  CXR revealed large L ptx and chest tube was placed in ER.  PCCM asked to assist with chest tube management.   SIGNIFICANT EVENTS  3/4>>admit, large L ptx, CT placed in ER  STUDIES:     HISTORY OF PRESENT ILLNESS: 82yo male with hx HTN, LBBB presented 3/4 with 2 week hx cough and congestion and new onset L sided back pain and SOB which woke him at 3am.  He was hypoxic on admit with sats 70's on RA.  CXR revealed large L ptx and chest tube was placed in ER.  PCCM asked to assist with chest tube management.   Currently feeling much better.  Denies chest pain, dyspnea, just mild pain at CT site.    PAST MEDICAL HISTORY :   has a past medical history of LBBB (left bundle branch block).  has past surgical history that includes Cyst excision. Prior to Admission medications   Medication Sig Start Date End Date Taking? Authorizing Provider  acetaminophen (TYLENOL) 325 MG tablet Take 650 mg by mouth every 6 (six) hours as needed for mild pain.   Yes Historical Provider, MD  ramipril (ALTACE) 2.5 MG capsule Take 1 capsule (2.5 mg total) by mouth daily. 07/20/15  Yes Thayer Headings, MD   Allergies  Allergen Reactions  . Coreg [Carvedilol] Other (See Comments)    Weakness     FAMILY HISTORY:  Family history is unknown by patient. SOCIAL HISTORY:  reports that he has quit smoking. He does not have any smokeless tobacco history on file.  REVIEW OF SYSTEMS:   As per HPI - All other systems reviewed and were neg.    SUBJECTIVE:   VITAL SIGNS: Temp:  [97.5 F (36.4 C)-98.9 F (37.2 C)] 98.9 F (37.2 C) (03/04  1046) Pulse Rate:  [63-92] 92 (03/04 1046) Resp:  [15-27] 16 (03/04 1046) BP: (99-173)/(59-96) 119/71 mmHg (03/04 1046) SpO2:  [91 %-100 %] 97 % (03/04 1046) Weight:  [68.04 kg (150 lb)] 68.04 kg (150 lb) (03/04 0514)  PHYSICAL EXAMINATION: General:  Pleasant elderly male, NAD  Neuro:  Awake, alert, appropriate, MAE HEENT:  Mm moist, no JVD  Cardiovascular:  s1s2 rrr Lungs:  resps even non labored on RA, L chest tube, dressing saturated (RN coming to change), no air leak  Abdomen:  Soft, +bs  Musculoskeletal:  Warm and dry, no edema   Recent Labs Lab 07/20/15 1130 07/24/15 0520  NA 139 140  K 4.6 4.3  CL 102 107  CO2 23 21*  BUN 25 27*  CREATININE 0.98 1.07  GLUCOSE 104* 108*    Recent Labs Lab 07/20/15 1130 07/24/15 0520  HGB 13.8 13.4  HCT 40.6 39.4  WBC 9.7 6.6  PLT 234 181   Dg Chest 2 View  07/24/2015  CLINICAL DATA:  Acute onset of left upper back pain and shortness of breath. Initial encounter. EXAM: CHEST  2 VIEW COMPARISON:  CT of the chest performed 10/18/2010 FINDINGS: There is complete collapse of the left lung, with a left-sided pneumothorax involving the entire left  hemithorax. No significant mediastinal shift is seen. Mild right basilar atelectasis is noted. No significant pleural effusion is identified. The cardiomediastinal silhouette is normal in size. No acute osseous abnormalities are seen. Degenerative change is noted at the left glenohumeral joint, with chronic remodeling humeral head. IMPRESSION: Complete collapse of the left lung, with a left-sided pneumothorax involving the entire left hemithorax. Mild right basilar atelectasis noted. Critical Value/emergent results were called by telephone at the time of interpretation on 07/24/2015 at 6:10 am to Dr. Lajean Saver, who verbally acknowledged these results. Electronically Signed   By: Garald Balding M.D.   On: 07/24/2015 06:12   Dg Chest Port 1 View  07/24/2015  CLINICAL DATA:  Status post chest tube  insertion EXAM: PORTABLE CHEST 1 VIEW COMPARISON:  July 24, 2015 FINDINGS: A left chest tube has been inserted in the interval. The distal tip projects over the thoracic aorta/mediastinum. The left pneumothorax is no longer present. The lucency beneath the right hemidiaphragm was noted to represent an interposed loop of colon on the lateral view of the film from earlier today. The cardiomediastinal silhouette is stable. No other interval changes. IMPRESSION: Interval placement of left chest tube. See above for details. The left pneumothorax is no longer visualized. Electronically Signed   By: Dorise Bullion III M.D   On: 07/24/2015 07:31    ASSESSMENT / PLAN:  Spontaneous L sided pneumothorax -- s/p chest tube placement 3/4.  ??r/t significant coughing over last 2 weeks in setting likely viral URI.   PLAN -  Continue chest tube to 20cm suction  F/u CXR in am  Cough suppression PRN  PRN pain rx  Supplemental O2 as needed   Nickolas Madrid, NP 07/24/2015  11:07 AM Pager: [phone] or [phone]

## 2015-07-24 NOTE — Progress Notes (Signed)
Pt received to unit.  Telemetry placed and CCMD notified.  Pt oriented to unit including telephone and call light.  Pt denies sob.  Pt stable.  Will cont to monitor.

## 2015-07-24 NOTE — Progress Notes (Signed)
Patient lying in bed, during assessment stated that his legs were sore. Tylenol given, call light within reach.

## 2015-07-24 NOTE — ED Notes (Signed)
Pt tolerated chest tube procedure well. Pt states "I feel much better now and can breathe better now"

## 2015-07-24 NOTE — ED Notes (Signed)
Per EMS, pt woke up with left back pain that felt like sprain and pt was sob. Pt's SPO2 at 78% on RA with fire on scene and he was placed on a NRB at [place]. When the NRB is taken off the pt complains of sob. Pt has no cardiac hx and no pulmonary hx. 12 lead showed LBBB. Rales in lower lobe ausculated by EMS. Pt seen at pcp a few days ago for regular check up where he had blood work completed. Pt given 324asa by fire. Pt alert and oriented x 4. BP 153/96, 81 HR, RR 20, 100% on NRB. 20ga in right upper arm.

## 2015-07-24 NOTE — H&P (Signed)
Date: 07/24/2015               Patient Name:  Lawrence Hicks MRN: [id number]  DOB: 23-May-1923 Age / Sex: 82 y.o., male   PCP: No Pcp Per Patient              Medical Service: Internal Medicine Teaching Service              Attending Physician: Dr. Lajean Saver, MD    First Contact: Lawrence Hicks, MS4 Pager: [phone]  Second Contact: Dr. Jacques Earthly Pager: [phone]            After Hours (After 5p/  First Contact Pager: [phone]  weekends / holidays): Second Contact Pager: [phone]   Chief Complaint: SOB and cough  History of Present Illness: Lawrence Hicks is a 82 y.o. M with PMHx including chronic LBBB and htn and remote smoking history who presents after feeling persistent pain over the left side of his back at approximately 3am with associated SOB. He describes the pain as being a sprain without any radiation to the jaw or arm. He is not currently in pain. He took tylenol and this slightly improved the pain.   He also had a 2 week hx of cough and congestion. The cough is dry and the mucous he was producing from his congestion was white in color. It became grey over the past couple of days. He denies fevers, chills, chest pain, nausea, vomiting, changes in bowel movements. He also has not had any sick contacts.  His oxygen saturation was 78% on RA and he was placed on 15L NRB by EMS. His o2 sat subsequently improved to 100%. In the ED, he remained dyspneic with a nonproductive cough. Physical exam showed diminished breath sounds over left lung fields. CXR showed large pneumothorax and a chest tube was placed. His respiratory status subsequently improved.   Meds: No current facility-administered medications for this encounter.   Current Outpatient Prescriptions  Medication Sig Dispense Refill  . acetaminophen (TYLENOL) 325 MG tablet Take 650 mg by mouth every 6 (six) hours as needed for mild pain.    . ramipril (ALTACE) 2.5 MG capsule Take 1 capsule (2.5 mg total) by mouth daily. 90  capsule 3    Allergies: Allergies as of 07/24/2015 - Review Complete 07/24/2015  Allergen Reaction Noted  . Coreg [carvedilol] Other (See Comments) 07/20/2015   Past Medical History  Diagnosis Date  . LBBB (left bundle branch block)    Past Surgical History  Procedure Laterality Date  . Cyst excision      82 yrs old   Family History  Problem Relation Age of Onset  . Family history unknown: Yes   Social History   Social History  . Marital Status: Married    Spouse Name: N/A  . Number of Children: N/A  . Years of Education: N/A   Occupational History  . Not on file.   Social History Main Topics  . Smoking status: Former Research scientist (life sciences)  . Smokeless tobacco: Not on file     Comment: 50 yrs ago  . Alcohol Use: Not on file  . Drug Use: Not on file  . Sexual Activity: Not on file   Other Topics Concern  . Not on file   Social History Narrative    Review of Systems: Pertinent items noted in HPI and remainder of comprehensive ROS otherwise negative.  Physical Exam: Blood pressure 138/80, pulse 64, temperature 97.5 F (36.4 C), temperature source Oral, resp. rate  18, height 5\' 7"  (1.702 m), weight 68.04 kg (150 lb), SpO2 100 %. BP 162/59 mmHg  Pulse 63  Temp(Src) 97.5 F (36.4 C) (Oral)  Resp 16  Ht 5\' 7"  (1.702 m)  Wt 68.04 kg (150 lb)  BMI 23.49 kg/m2  SpO2 99% General appearance: alert, cooperative, appears stated age and no distress Head: Normocephalic, without obvious abnormality, atraumatic, sinuses nontender to percussion Eyes: Conjunctiva clear with moist mucous membranes Throat: Lips, mucosa and tongue moist Back: Chest tube present over left lower lobe. no tenderness to palpation Lungs: clear to auscultation bilaterally and normal respiratory effort Chest wall: No tenderness to palpation Heart: RRR with nl s1 and s2 Abdomen: soft, non-tender; bowel sounds normal; no masses,  no organomegaly Extremities: extremities normal, atraumatic, no cyanosis or  edema Skin: Skin color, texture, turgor normal. No rashes or lesions Lymph nodes: no cervical or submandibular LAD  Lab results: Basic Metabolic Panel:  Recent Labs  07/24/15 0520  NA 140  K 4.3  CL 107  CO2 21*  GLUCOSE 108*  BUN 27*  CREATININE 1.07  CALCIUM 9.0   Liver Function Tests: No results for input(s): AST, ALT, ALKPHOS, BILITOT, PROT, ALBUMIN in the last 72 hours. No results for input(s): LIPASE, AMYLASE in the last 72 hours. No results for input(s): AMMONIA in the last 72 hours. CBC:  Recent Labs  07/24/15 0520  WBC 6.6  HGB 13.4  HCT 39.4  MCV 91.4  PLT 181   Cardiac Enzymes: No results for input(s): CKTOTAL, CKMB, CKMBINDEX, TROPONINI in the last 72 hours. BNP: No results for input(s): PROBNP in the last 72 hours. D-Dimer: No results for input(s): DDIMER in the last 72 hours. CBG: No results for input(s): GLUCAP in the last 72 hours. Hemoglobin A1C: No results for input(s): HGBA1C in the last 72 hours. Fasting Lipid Panel: No results for input(s): CHOL, HDL, LDLCALC, TRIG, CHOLHDL, LDLDIRECT in the last 72 hours. Thyroid Function Tests: No results for input(s): TSH, T4TOTAL, FREET4, T3FREE, THYROIDAB in the last 72 hours. Anemia Panel: No results for input(s): VITAMINB12, FOLATE, FERRITIN, TIBC, IRON, RETICCTPCT in the last 72 hours. Coagulation: No results for input(s): LABPROT, INR in the last 72 hours. Urine Drug Screen: Drugs of Abuse  No results found for: LABOPIA, COCAINSCRNUR, LABBENZ, AMPHETMU, THCU, LABBARB  Alcohol Level: No results for input(s): ETH in the last 72 hours. Urinalysis: No results for input(s): COLORURINE, LABSPEC, PHURINE, GLUCOSEU, HGBUR, BILIRUBINUR, KETONESUR, PROTEINUR, UROBILINOGEN, NITRITE, LEUKOCYTESUR in the last 72 hours.  Invalid input(s): APPERANCEUR Misc. Labs: None  Imaging results:  Dg Chest 2 View  07/24/2015  CLINICAL DATA:  Acute onset of left upper back pain and shortness of breath. Initial  encounter. EXAM: CHEST  2 VIEW COMPARISON:  CT of the chest performed 10/18/2010 FINDINGS: There is complete collapse of the left lung, with a left-sided pneumothorax involving the entire left hemithorax. No significant mediastinal shift is seen. Mild right basilar atelectasis is noted. No significant pleural effusion is identified. The cardiomediastinal silhouette is normal in size. No acute osseous abnormalities are seen. Degenerative change is noted at the left glenohumeral joint, with chronic remodeling humeral head. IMPRESSION: Complete collapse of the left lung, with a left-sided pneumothorax involving the entire left hemithorax. Mild right basilar atelectasis noted. Critical Value/emergent results were called by telephone at the time of interpretation on 07/24/2015 at 6:10 am to Dr. Lajean Saver, who verbally acknowledged these results. Electronically Signed   By: Garald Balding M.D.   On: 07/24/2015  06:12   Dg Chest Port 1 View  07/24/2015  CLINICAL DATA:  Status post chest tube insertion EXAM: PORTABLE CHEST 1 VIEW COMPARISON:  July 24, 2015 FINDINGS: A left chest tube has been inserted in the interval. The distal tip projects over the thoracic aorta/mediastinum. The left pneumothorax is no longer present. The lucency beneath the right hemidiaphragm was noted to represent an interposed loop of colon on the lateral view of the film from earlier today. The cardiomediastinal silhouette is stable. No other interval changes. IMPRESSION: Interval placement of left chest tube. See above for details. The left pneumothorax is no longer visualized. Electronically Signed   By: Dorise Bullion III M.D   On: 07/24/2015 07:31    Other results: EKG: sinus rhythm. Old LBBB and LAD unchanged from prior.  Assessment & Plan by Problem: Active Problems:   * No active hospital problems. *  Lawrence Hicks is a 82 y.o. M with PMHx including chronic LBBB and htn and remote smoking history who presents with 2 week history  of cough and congestion and acute onset of left sided back pain, SOB and who is found to have absent left sided lung sounds, decreased o2 sat, and CXR findings consistent with spontaneous pneumothorax. Differential diagnosis includes acute MI and PE. Spontaneous pneumothorax most likely given clinical and radiographic findings.  Spontaneous Pneumothorax -Chest tube in place. Pulmonology will follow -Tylenol PRN for pain -Continue 2L nasal cannula  Cough/Congestion Most likely URI. Patient had influenza vaccine last fall. -Influenza panel to r/o flu -Decongestant  Hx of LBBB LBBB is stable from previous EKG.   Hx of HTN -continue home ramipril 2.5mg  daily  Dispo: Will admit to [facility] Internal Medicine teaching service   This is a Medical Student Note.  The care of the patient was discussed with Dr. Randell Patient and the assessment and plan was formulated with their assistance.  Please see their note for official documentation of the patient encounter.   Signed: Domingo Hicks, Med Student 07/24/2015, 7:42 AM

## 2015-07-24 NOTE — H&P (Signed)
Date: 07/24/2015               Patient Name:  Lawrence Hicks MRN: [id number]  DOB: March 15, 1924 Age / Sex: 82 y.o., male   PCP: No Pcp Per Patient         Medical Service: Internal Medicine Teaching Service         Attending Physician: Dr. Sid Falcon, MD    First Contact: Domingo Madeira, MS4 Pager: [phone]  Second Contact: Dr. Randell Patient Pager: [phone]       After Hours (After 5p/  First Contact Pager: [phone]  weekends / holidays): Second Contact Pager: [phone]   Chief Complaint: dyspnea  History of Present Illness: [name] is a 82 year old man with history of LBBB, HTN presenting with left upper back pain and dyspnea. He reports the pain woke up from sleep at 4AM this morning. He describes the pain similar to a sprain. It is constant. It radiated to the right side of his back. Tylenol helped relieve some of the pain. No exacerbating factors. He had associated labored breathing. Denies chest pain. No similar episode in the past. He has had an upper respiratory infection including rhinorrhea, sneezing, some productive cough with white to grey mucous for the past two weeks. Denies fevers, chills, hemoptysis, nausea, vomiting, diarrhea. Denies trauma. No sick contacts. No recent travel. Lives alone. 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 No history of exposures to chemicals.  Meds: Current Facility-Administered Medications  Medication Dose Route Frequency Provider Last Rate Last Dose  . cromolyn (NASALCROM) nasal spray 1 spray  1 spray Each Nare QID Milagros Loll, MD       Current Outpatient Prescriptions  Medication Sig Dispense Refill  . acetaminophen (TYLENOL) 325 MG tablet Take 650 mg by mouth every 6 (six) hours as needed for mild pain.    . ramipril (ALTACE) 2.5 MG capsule Take 1 capsule (2.5 mg total) by mouth daily. 90 capsule 3    Allergies: Allergies as of 07/24/2015 - Review Complete 07/24/2015  Allergen Reaction Noted  . Coreg [carvedilol]  Other (See Comments) 07/20/2015   Past Medical History  Diagnosis Date  . LBBB (left bundle branch block)    Past Surgical History  Procedure Laterality Date  . Cyst excision      82 yrs old   Family History  Problem Relation Age of Onset  . Family history unknown: Yes  Denies family history of lung disease or pneumothorax.  Social History   Social History  . Marital Status: Married    Spouse Name: N/A  . Number of Children: N/A  . Years of Education: N/A   Occupational History  . Not on file.   Social History Main Topics  . Smoking status: Former Research scientist (life sciences)  . Smokeless tobacco: Not on file     Comment: 50 yrs ago  . Alcohol Use: Not on file  . Drug Use: Not on file  . Sexual Activity: Not on file   Other Topics Concern  . Not on file   Social History Narrative  Previously used pipe tobacco. Denies alcohol use or drug use.  Review of Systems: Constitutional: no fevers/chills Eyes: no vision changes Ears, nose, mouth, throat, and face: +some cough Respiratory: +shortness of breath Cardiovascular: no chest pain Gastrointestinal: no nausea/vomiting, no abdominal pain, no constipation, no diarrhea Genitourinary: no dysuria, no hematuria Integument: no rash Hematologic/lymphatic: no bleeding/bruising, no edema Musculoskeletal: +chronic arthralgias, no myalgias Neurological: no paresthesias, no weakness  Physical  Exam: Blood pressure 99/82, pulse 66, temperature 97.5 F (36.4 C), temperature source Oral, resp. rate 16, height 5\' 7"  (1.702 m), weight 150 lb (68.04 kg), SpO2 99 %. General Apperance: NAD Head: Normocephalic, atraumatic Eyes: PERRL, EOMI, anicteric sclera Ears: Normal external ear canal Nose: Nares normal, septum midline, mucosa normal Throat: Lips, mucosa and tongue normal  Neck: Supple, trachea midline Back: No tenderness or bony abnormality  Lungs: Decreased breath sounds bilaterally. Few basilar rales. No wheezes.  Chest Wall: Left chest tube  in place, no air leak. Heart: Regular rate and rhythm, no murmur/rub/gallop Abdomen: Soft, nontender, nondistended, no rebound/guarding Extremities: Normal, atraumatic, warm and well perfused, no edema Pulses: 2+ throughout Skin: No rashes or lesions Neurologic: Alert and oriented x 3. CNII-XII intact. Normal strength and sensation  Lab results: Basic Metabolic Panel:  Recent Labs  07/24/15 0520  NA 140  K 4.3  CL 107  CO2 21*  GLUCOSE 108*  BUN 27*  CREATININE 1.07  CALCIUM 9.0   CBC:  Recent Labs  07/24/15 0520  WBC 6.6  HGB 13.4  HCT 39.4  MCV 91.4  PLT 181   Imaging results:  Dg Chest 2 View  07/24/2015  CLINICAL DATA:  Acute onset of left upper back pain and shortness of breath. Initial encounter. EXAM: CHEST  2 VIEW COMPARISON:  CT of the chest performed 10/18/2010 FINDINGS: There is complete collapse of the left lung, with a left-sided pneumothorax involving the entire left hemithorax. No significant mediastinal shift is seen. Mild right basilar atelectasis is noted. No significant pleural effusion is identified. The cardiomediastinal silhouette is normal in size. No acute osseous abnormalities are seen. Degenerative change is noted at the left glenohumeral joint, with chronic remodeling humeral head. IMPRESSION: Complete collapse of the left lung, with a left-sided pneumothorax involving the entire left hemithorax. Mild right basilar atelectasis noted. Critical Value/emergent results were called by telephone at the time of interpretation on 07/24/2015 at 6:10 am to Dr. Lajean Saver, who verbally acknowledged these results. Electronically Signed   By: Garald Balding M.D.   On: 07/24/2015 06:12   Dg Chest Port 1 View  07/24/2015  CLINICAL DATA:  Status post chest tube insertion EXAM: PORTABLE CHEST 1 VIEW COMPARISON:  July 24, 2015 FINDINGS: A left chest tube has been inserted in the interval. The distal tip projects over the thoracic aorta/mediastinum. The left pneumothorax  is no longer present. The lucency beneath the right hemidiaphragm was noted to represent an interposed loop of colon on the lateral view of the film from earlier today. The cardiomediastinal silhouette is stable. No other interval changes. IMPRESSION: Interval placement of left chest tube. See above for details. The left pneumothorax is no longer visualized. Electronically Signed   By: Dorise Bullion III M.D   On: 07/24/2015 07:31    Other results: EKG: NSR, LBBB, no significant changes since previous   Assessment & Plan by Problem: 82 year old man with history of LBBB, HTN presenting with left upper back pain and dyspnea.  Spontaneous pneumothorax: Initial CXR with pneumothorax on left side. Chest tube placed by ED with resolution of pneumothorax. Probable precipitation by viral URI. No history of underlying lung disease. Doubt necrotizing PNA. Afebrile with no leukocytosis. -Pulmonary following for assistance with chest tube management -Supplemental oxygen -CT to 20cm suction -Pain meds prn -Repeat CXR in AM -Pulm toilet with IS  Viral URI: Flu panel negative. -Flonase daily -Dextromethorphan BID prn cough -Supportive care  HTN: Continue home ramipril.  VTE  ppx: Lovenox  Code: Discussed with patient and he wishes to be full code.  Dispo: Disposition is deferred at this time, awaiting improvement of current medical problems. Anticipated discharge in approximately 1-2 day(s).   The patient does not have a current PCP (No Pcp Per Patient) and does not need an [facility] hospital follow-up appointment after discharge.  The patient does not have transportation limitations that hinder transportation to clinic appointments.  Signed: Milagros Loll, MD PGY-2 07/24/2015, 10:22 AM

## 2015-07-24 NOTE — ED Notes (Signed)
Attempted report 

## 2015-07-25 ENCOUNTER — Inpatient Hospital Stay (HOSPITAL_COMMUNITY): Payer: Medicare Other

## 2015-07-25 DIAGNOSIS — I10 Essential (primary) hypertension: Secondary | ICD-10-CM

## 2015-07-25 DIAGNOSIS — J939 Pneumothorax, unspecified: Secondary | ICD-10-CM | POA: Insufficient documentation

## 2015-07-25 DIAGNOSIS — R059 Cough, unspecified: Secondary | ICD-10-CM | POA: Insufficient documentation

## 2015-07-25 DIAGNOSIS — R05 Cough: Secondary | ICD-10-CM | POA: Insufficient documentation

## 2015-07-25 DIAGNOSIS — J9383 Other pneumothorax: Principal | ICD-10-CM

## 2015-07-25 DIAGNOSIS — Z9689 Presence of other specified functional implants: Secondary | ICD-10-CM | POA: Insufficient documentation

## 2015-07-25 DIAGNOSIS — J069 Acute upper respiratory infection, unspecified: Secondary | ICD-10-CM | POA: Insufficient documentation

## 2015-07-25 DIAGNOSIS — R0902 Hypoxemia: Secondary | ICD-10-CM | POA: Insufficient documentation

## 2015-07-25 LAB — CBC
HEMATOCRIT: 36.5 % — AB (ref 39.0–52.0)
Hemoglobin: 12 g/dL — ABNORMAL LOW (ref 13.0–17.0)
MCH: 29.8 pg (ref 26.0–34.0)
MCHC: 32.9 g/dL (ref 30.0–36.0)
MCV: 90.6 fL (ref 78.0–100.0)
Platelets: 193 10*3/uL (ref 150–400)
RBC: 4.03 MIL/uL — ABNORMAL LOW (ref 4.22–5.81)
RDW: 14.1 % (ref 11.5–15.5)
WBC: 7.2 10*3/uL (ref 4.0–10.5)

## 2015-07-25 LAB — BASIC METABOLIC PANEL
Anion gap: 10 (ref 5–15)
BUN: 24 mg/dL — AB (ref 6–20)
CALCIUM: 8.7 mg/dL — AB (ref 8.9–10.3)
CO2: 22 mmol/L (ref 22–32)
CREATININE: 0.96 mg/dL (ref 0.61–1.24)
Chloride: 107 mmol/L (ref 101–111)
GFR calc non Af Amer: >60 mL/min (ref >60)
Glucose, Bld: 95 mg/dL (ref 65–99)
Potassium: 4.1 mmol/L (ref 3.5–5.1)
SODIUM: 139 mmol/L (ref 135–145)

## 2015-07-25 NOTE — Evaluation (Signed)
Physical Therapy Evaluation/ Discharge (per pt request) Patient Details Name: Lawrence Hicks MRN: [id number] DOB: [birth year] Today's Date: 07/25/2015   History of Present Illness  82 yo admitted with left spontaneous PTX. PMhx: LBBB, HTN  Clinical Impression  Pt initially pleasant and cooperative with increased difficulty with transfers and gait. Pt demonstrates impaired balance, gait and safety awareness. Pt without available 24 hour assist at home, denies falls, and when educated for noted deficits during evaluation he became defensive and agitated with recommendation for further therapy, DME and assist. Pt is at high risk for falls, could not maintain sitting balance on his own, and had difficulty with ambulation. Pt remained adamant after education and explanation of therapy that he is very independent, has always been and is only here for his breathing and nothing else. Pt refusing any further therapy in acute or home setting and is not receptive to any education for DME use or fall risk reduction. Pt with sats 90% on RA with activity with chest tube to water seal and HR 112. Will sign off per pt request with RN educated for above.     Follow Up Recommendations Supervision for mobility/OOB;Home health PT- pt refuses    Equipment Recommendations  Rolling walker with 5" wheels- pt refuses    Recommendations for Other Services       Precautions / Restrictions Precautions Precautions: Fall Precaution Comments: chest tube      Mobility  Bed Mobility Overal bed mobility: Needs Assistance Bed Mobility: Supine to Sit     Supine to sit: Min guard     General bed mobility comments: assist of rail and min assist for balance EOB as pt with 3 repeated posterior LOB   Transfers Overall transfer level: Needs assistance   Transfers: Sit to/from Stand Sit to Stand: Min guard         General transfer comment: cues for hand placement and safety as pt forgetful of chest tube, also  impulsive and needed guarding for balance  Ambulation/Gait Ambulation/Gait assistance: Min guard Ambulation Distance (Feet): 250 Feet Assistive device: Rolling walker (2 wheeled) Gait Pattern/deviations: Step-through pattern;Decreased stride length;Trunk flexed   Gait velocity interpretation: at or above normal speed for age/gender General Gait Details: max cues for looking up, position in RW, avoiding obstacles and safety. pt not clearing obstacles x 4 in hallway, gait too quick for pt safety and pt adamant he would perform better with cane  Stairs            Wheelchair Mobility    Modified Rankin (Stroke Patients Only)       Balance Overall balance assessment: Needs assistance   Sitting balance-Leahy Scale: Poor       Standing balance-Leahy Scale: Poor                               Pertinent Vitals/Pain Pain Assessment: No/denies pain    Home Living Family/patient expects to be discharged to:: Private residence Living Arrangements: Alone Available Help at Discharge: Friend(s);Available PRN/intermittently Type of Home: House       Home Layout: One level Home Equipment: Cane - single point      Prior Function Level of Independence: Independent with assistive device(s)   Gait / Transfers Assistance Needed: pt walks with a cane out of the house, nothing inside, takes out his own trash cans   ADL's / Homemaking Assistance Needed: independent with ADLs has neighbor drive him to appointments and  shopping        Hand Dominance        Extremity/Trunk Assessment   Upper Extremity Assessment: Generalized weakness           Lower Extremity Assessment: Generalized weakness      Cervical / Trunk Assessment: Kyphotic  Communication   Communication: HOH  Cognition Arousal/Alertness: Awake/alert Behavior During Therapy: WFL for tasks assessed/performed Overall Cognitive Status: Within Functional Limits for tasks assessed Area of  Impairment: Safety/judgement         Safety/Judgement: Decreased awareness of safety;Decreased awareness of deficits          General Comments      Exercises        Assessment/Plan    PT Assessment Patient needs continued PT services  PT Diagnosis Difficulty walking;Generalized weakness   PT Problem List Decreased strength;Decreased activity tolerance;Decreased balance;Decreased mobility;Decreased safety awareness  PT Treatment Interventions     PT Goals (Current goals can be found in the Care Plan section) Acute Rehab PT Goals PT Goal Formulation: All assessment and education complete, DC therapy    Frequency     Barriers to discharge Decreased caregiver support Recommending therapy follow up acutely and post acute however pt adamantly refusing any additional therapy at this time. Will D/C therapy per pt request.    Co-evaluation               End of Session   Activity Tolerance: Patient tolerated treatment well Patient left: in chair;with call bell/phone within reach Nurse Communication: Mobility status;Precautions         Time: [id number] PT Time Calculation (min) (ACUTE ONLY): 27 min   Charges:   PT Evaluation $PT Eval Moderate Complexity: 1 Procedure PT Treatments $Gait Training: 8-22 mins   PT G CodesMelford Aase 07/25/2015, 2:18 PM Elwyn Reach, [place]

## 2015-07-25 NOTE — Progress Notes (Signed)
Subjective: No acute events overnight. He reports he is doing well this morning. Denies pain. No chest pain or dyspnea.  Objective: Vital signs in last 24 hours: Filed Vitals:   07/24/15 1046 07/24/15 1446 07/24/15 2016 07/25/15 0418  BP: 119/71 143/59 140/67 145/63  Pulse: 92 67 67 61  Temp: 98.9 F (37.2 C) 98 F (36.7 C) 97.8 F (36.6 C) 98 F (36.7 C)  TempSrc: Oral Oral Oral Oral  Resp: 16 26 20 18   Height:      Weight:      SpO2: 97% 97% 92% 96%   Weight change:   Intake/Output Summary (Last 24 hours) at 07/25/15 [id number] Last data filed at 07/25/15 0700  Gross per 24 hour  Intake    240 ml  Output    175 ml  Net     65 ml   General Apperance: NAD HEENT: Normocephalic, atraumatic, anicteric sclera Neck: Supple, trachea midline Lungs: Clear to auscultation bilaterally. No wheezes, rhonchi or rales. Left chest tube in place, no air leak. Heart: Regular rate and rhythm, no murmur/rub/gallop Abdomen: Soft, nontender, nondistended, no rebound/guarding Extremities: Warm and well perfused, no edema Skin: No rashes or lesions Neurologic: Alert and interactive. No gross deficits.  Lab Results: Basic Metabolic Panel:  Recent Labs Lab 07/24/15 0520 07/25/15 0404  NA 140 139  K 4.3 4.1  CL 107 107  CO2 21* 22  GLUCOSE 108* 95  BUN 27* 24*  CREATININE 1.07 0.96  CALCIUM 9.0 8.7*   Liver Function Tests:  Recent Labs Lab 07/20/15 1130  AST 17  ALT 12  ALKPHOS 105  BILITOT 0.5  PROT 6.8  ALBUMIN 4.2   CBC:  Recent Labs Lab 07/20/15 1130  07/24/15 1606 07/25/15 0404  WBC 9.7  < > 9.2 7.2  NEUTROABS 7.5  --   --   --   HGB 13.8  < > 11.6* 12.0*  HCT 40.6  < > 35.6* 36.5*  MCV 89.4  < > 91.5 90.6  PLT 234  < > 180 193  < > = values in this interval not displayed.  Micro Results: No results found for this or any previous visit (from the past 240 hour(s)).   Studies/Results: Dg Chest 2 View  07/24/2015  CLINICAL DATA:  Acute onset of left upper  back pain and shortness of breath. Initial encounter. EXAM: CHEST  2 VIEW COMPARISON:  CT of the chest performed 10/18/2010 FINDINGS: There is complete collapse of the left lung, with a left-sided pneumothorax involving the entire left hemithorax. No significant mediastinal shift is seen. Mild right basilar atelectasis is noted. No significant pleural effusion is identified. The cardiomediastinal silhouette is normal in size. No acute osseous abnormalities are seen. Degenerative change is noted at the left glenohumeral joint, with chronic remodeling humeral head. IMPRESSION: Complete collapse of the left lung, with a left-sided pneumothorax involving the entire left hemithorax. Mild right basilar atelectasis noted. Critical Value/emergent results were called by telephone at the time of interpretation on 07/24/2015 at 6:10 am to Dr. Lajean Saver, who verbally acknowledged these results. Electronically Signed   By: Garald Balding M.D.   On: 07/24/2015 06:12   Dg Chest Port 1 View  07/25/2015  CLINICAL DATA:  Left bundle branch block, followup left chest tube EXAM: PORTABLE CHEST 1 VIEW COMPARISON:  07/24/2015 FINDINGS: Limited inspiratory effect. Stable mild cardiac silhouette enlargement. Right lung clear except for minimal atelectasis at the base. On the left, stable chest tube. No pneumothorax.  Stable mild left base opacities. No pneumothorax identified. IMPRESSION: Stable left chest tube and bibasilar opacities Electronically Signed   By: Skipper Cliche M.D.   On: 07/25/2015 08:54   Dg Chest Portable 1 View  07/24/2015  CLINICAL DATA:  Two weeks of cough and congestion, new onset left-sided back pain and shortness of breath. Large left pneumothorax identified on chest x-ray from earlier today, status post chest tube placement. Nurse describes a large hematoma about the chest tube. EXAM: PORTABLE CHEST 1 VIEW COMPARISON:  Chest x-rays from earlier today. FINDINGS: Cardiomediastinal silhouette appears stable in  size and configuration. Left-sided chest tube is stable in position with tip directed towards the left perihilar region. No pneumothorax seen. Probable mild bibasilar atelectasis. Lungs otherwise clear. No pleural effusion/hemothorax seen. Soft tissues about the chest are unremarkable, perhaps mild soft tissue prominence at the left-sided chest tube insertion site but not convincing. No osseous fracture or dislocation. Marked degenerative change again noted at the left shoulder. IMPRESSION: No change compared to the most recent prior chest x-ray from earlier today. Left-sided chest tube remains stable in position with tip directed towards the left hilum. No pneumothorax seen. No pleural effusion/hemothorax seen. Perhaps mild soft tissue prominence at the chest tube insertion site but not convincing. Electronically Signed   By: Franki Cabot M.D.   On: 07/24/2015 18:26   Dg Chest Port 1 View  07/24/2015  CLINICAL DATA:  Status post chest tube insertion EXAM: PORTABLE CHEST 1 VIEW COMPARISON:  July 24, 2015 FINDINGS: A left chest tube has been inserted in the interval. The distal tip projects over the thoracic aorta/mediastinum. The left pneumothorax is no longer present. The lucency beneath the right hemidiaphragm was noted to represent an interposed loop of colon on the lateral view of the film from earlier today. The cardiomediastinal silhouette is stable. No other interval changes. IMPRESSION: Interval placement of left chest tube. See above for details. The left pneumothorax is no longer visualized. Electronically Signed   By: Dorise Bullion III M.D   On: 07/24/2015 07:31   Medications: I have reviewed the patient's current medications. Scheduled Meds: . fluticasone  2 spray Each Nare Daily  . sodium chloride flush  3 mL Intravenous Q12H   Continuous Infusions:  PRN Meds:.acetaminophen **OR** acetaminophen, dextromethorphan, HYDROcodone-acetaminophen, HYDROmorphone (DILAUDID) injection, polyethylene  glycol Assessment/Plan: 82 year old man with history of LBBB, HTN presenting with left upper back pain and dyspnea.  Spontaneous pneumothorax: Repeat CXR 3/5 stable. -Pulmonary following, appreciate recommendations. -Supplemental oxygen -CT to water seal -Pain meds prn -Pulm toilet with IS  Viral URI: Flu panel negative.  -Flonase daily -Dextromethorphan BID prn cough -Supportive care  HTN: Continue home ramipril.  VTE ppx: Lovenox  Code: FULL   Dispo: Disposition is deferred at this time, awaiting improvement of current medical problems.  Anticipated discharge in approximately 1 day(s).   The patient does not have a current PCP (No Pcp Per Patient) and does not need an [facility] hospital follow-up appointment after discharge.  The patient does not have transportation limitations that hinder transportation to clinic appointments.  .Services Needed at time of discharge: Y = Yes, Blank = No PT:   OT:   RN:   Equipment:   Other:     LOS: 1 day   Milagros Loll, MD 07/25/2015, 9:52 AM

## 2015-07-25 NOTE — Progress Notes (Signed)
  Date: 07/25/2015  Patient name: Lawrence Hicks  Medical record number: [id number]  Date of birth: [birth year]   I have seen and evaluated Lawrence Hicks and discussed their care with the Residency Team. Briefly, Lawrence Hicks is a 82yo man with PMH of LBBB and HTN who presented with 2 weeks of URI symptoms with nonproductive cough.  On the morning of admission, he awoke with back pain which did not improve and he presented to the hospital.  On CXR he was found to have a left sided pneumothorax and chest tube was placed in the ED.    Filed Vitals:   07/24/15 2016 07/25/15 0418  BP: 140/67 145/63  Pulse: 67 61  Temp: 97.8 F (36.6 C) 98 F (36.7 C)  Resp: 20 18   Physical exam:  Gen: Elderly man, NAD, hard of hearing HENT: NCAT, moist MM CV: RR, NR, no murmur noted Pulm: CTAB bilaterally, some crackles at base of left lung Chest wall: Chest tube in place, small amount of sanguinous fluid in tube Abd: Thin, soft, NT Ext: Thin, no edema  Assessment and Plan: I have seen and evaluated the patient as outlined above. I agree with the formulated Assessment and Plan as detailed in the residents' note, with the following changes:   1. Spontaneous PTX - pulmonary consulted for assistance - CT to 20cm suction today - Pain meds as needed - CXR in the AM  2. URI - Flonase and supportive care  3. HTN On home ramipril which was continued.  Monitor for worsening and consider adding another agent as needed.   Lawrence Falcon, MD 3/5/201710:28 AM

## 2015-07-25 NOTE — Progress Notes (Signed)
Name: Lawrence Hicks MRN: [id number] DOB: 08/04/1923    ADMISSION DATE:  07/24/2015 CONSULTATION DATE:  3/4  REFERRING MD :  IMTS   CHIEF COMPLAINT:  ptx   BRIEF PATIENT DESCRIPTION: 82yo male with hx HTN, LBBB presented 3/4 with 2 week hx cough and congestion and new onset L sided back pain and SOB which woke him at 3am.  He was hypoxic on admit with sats 70's on RA.  CXR revealed large L ptx and chest tube was placed in ER.  PCCM asked to assist with chest tube management.   SIGNIFICANT EVENTS  3/4>>admit, large L ptx, CT placed in ER  STUDIES:    SUBJECTIVE:  Feels better.  No c/o.   VITAL SIGNS: Temp:  [97.8 F (36.6 C)-98 F (36.7 C)] 97.8 F (36.6 C) (03/05 1409) Pulse Rate:  [61-110] 110 (03/05 1413) Resp:  [16-20] 16 (03/05 1409) BP: (123-145)/(63-67) 123/67 mmHg (03/05 1409) SpO2:  [90 %-99 %] 90 % (03/05 1413)  PHYSICAL EXAMINATION: General:  Pleasant elderly male, NAD  Neuro:  Awake, alert, appropriate, MAE HEENT:  Mm moist, no JVD  Cardiovascular:  s1s2 rrr Lungs:  resps even non labored on RA, L chest tube, no air leak  Abdomen:  Soft, +bs  Musculoskeletal:  Warm and dry, no edema   Recent Labs Lab 07/20/15 1130 07/24/15 0520 07/25/15 0404  NA 139 140 139  K 4.6 4.3 4.1  CL 102 107 107  CO2 23 21* 22  BUN 25 27* 24*  CREATININE 0.98 1.07 0.96  GLUCOSE 104* 108* 95    Recent Labs Lab 07/24/15 0520 07/24/15 1606 07/25/15 0404  HGB 13.4 11.6* 12.0*  HCT 39.4 35.6* 36.5*  WBC 6.6 9.2 7.2  PLT 181 180 193   Dg Chest 2 View  07/24/2015  CLINICAL DATA:  Acute onset of left upper back pain and shortness of breath. Initial encounter. EXAM: CHEST  2 VIEW COMPARISON:  CT of the chest performed 10/18/2010 FINDINGS: There is complete collapse of the left lung, with a left-sided pneumothorax involving the entire left hemithorax. No significant mediastinal shift is seen. Mild right basilar atelectasis is noted. No significant pleural effusion is  identified. The cardiomediastinal silhouette is normal in size. No acute osseous abnormalities are seen. Degenerative change is noted at the left glenohumeral joint, with chronic remodeling humeral head. IMPRESSION: Complete collapse of the left lung, with a left-sided pneumothorax involving the entire left hemithorax. Mild right basilar atelectasis noted. Critical Value/emergent results were called by telephone at the time of interpretation on 07/24/2015 at 6:10 am to Dr. Lajean Saver, who verbally acknowledged these results. Electronically Signed   By: Garald Balding M.D.   On: 07/24/2015 06:12   Dg Chest Port 1 View  07/25/2015  CLINICAL DATA:  Left bundle branch block, followup left chest tube EXAM: PORTABLE CHEST 1 VIEW COMPARISON:  07/24/2015 FINDINGS: Limited inspiratory effect. Stable mild cardiac silhouette enlargement. Right lung clear except for minimal atelectasis at the base. On the left, stable chest tube. No pneumothorax. Stable mild left base opacities. No pneumothorax identified. IMPRESSION: Stable left chest tube and bibasilar opacities Electronically Signed   By: Skipper Cliche M.D.   On: 07/25/2015 08:54   Dg Chest Portable 1 View  07/24/2015  CLINICAL DATA:  Two weeks of cough and congestion, new onset left-sided back pain and shortness of breath. Large left pneumothorax identified on chest x-ray from earlier today, status post chest tube placement. Nurse describes a large hematoma about  the chest tube. EXAM: PORTABLE CHEST 1 VIEW COMPARISON:  Chest x-rays from earlier today. FINDINGS: Cardiomediastinal silhouette appears stable in size and configuration. Left-sided chest tube is stable in position with tip directed towards the left perihilar region. No pneumothorax seen. Probable mild bibasilar atelectasis. Lungs otherwise clear. No pleural effusion/hemothorax seen. Soft tissues about the chest are unremarkable, perhaps mild soft tissue prominence at the left-sided chest tube insertion site  but not convincing. No osseous fracture or dislocation. Marked degenerative change again noted at the left shoulder. IMPRESSION: No change compared to the most recent prior chest x-ray from earlier today. Left-sided chest tube remains stable in position with tip directed towards the left hilum. No pneumothorax seen. No pleural effusion/hemothorax seen. Perhaps mild soft tissue prominence at the chest tube insertion site but not convincing. Electronically Signed   By: Franki Cabot M.D.   On: 07/24/2015 18:26   Dg Chest Port 1 View  07/24/2015  CLINICAL DATA:  Status post chest tube insertion EXAM: PORTABLE CHEST 1 VIEW COMPARISON:  July 24, 2015 FINDINGS: A left chest tube has been inserted in the interval. The distal tip projects over the thoracic aorta/mediastinum. The left pneumothorax is no longer present. The lucency beneath the right hemidiaphragm was noted to represent an interposed loop of colon on the lateral view of the film from earlier today. The cardiomediastinal silhouette is stable. No other interval changes. IMPRESSION: Interval placement of left chest tube. See above for details. The left pneumothorax is no longer visualized. Electronically Signed   By: Dorise Bullion III M.D   On: 07/24/2015 07:31    ASSESSMENT / PLAN:  Spontaneous L sided pneumothorax -- s/p chest tube placement 3/4.  ??r/t significant coughing over last 2 weeks in setting likely viral URI.   PLAN -  Change CT to water seal today with f/u CXR in am  Cough suppression PRN  PRN pain rx   Supplemental O2 as needed   Nickolas Madrid, NP 07/25/2015  2:57 PM Pager: [phone] or [phone]

## 2015-07-25 NOTE — Progress Notes (Signed)
Subjective: Lawrence Hicks did well overnight with no acute events. He denies SOB but states that his cough is still present intermittently. It is currently nonproductive. He states that his congestion has improved. He denies chest pain, fevers, chills.  Objective: Vital signs in last 24 hours: Filed Vitals:   07/24/15 1046 07/24/15 1446 07/24/15 2016 07/25/15 0418  BP: 119/71 143/59 140/67 145/63  Pulse: 92 67 67 61  Temp: 98.9 F (37.2 C) 98 F (36.7 C) 97.8 F (36.6 C) 98 F (36.7 C)  TempSrc: Oral Oral Oral Oral  Resp: 16 26 20 18   Height:      Weight:      SpO2: 97% 97% 92% 96%   Weight change:   Intake/Output Summary (Last 24 hours) at 07/25/15 1002 Last data filed at 07/25/15 0700  Gross per 24 hour  Intake    240 ml  Output    175 ml  Net     65 ml   BP 145/63 mmHg  Pulse 61  Temp(Src) 98 F (36.7 C) (Oral)  Resp 18  Ht 5\' 7"  (1.702 m)  Wt 68.04 kg (150 lb)  BMI 23.49 kg/m2  SpO2 96%   General appearance: alert, cooperative, appears stated age and no distress Head: Normocephalic, without obvious abnormality, atraumatic, sinuses nontender to percussion Back: Chest tube present over left lower lobe. no tenderness to palpation Lungs: clear to auscultation bilaterally and normal respiratory effort Chest wall: No tenderness to palpation Heart: RRR with nl s1 and s2 Extremities: extremities normal, atraumatic, no cyanosis or edema  Lab Results: Basic Metabolic Panel:  Recent Labs  07/24/15 0520 07/25/15 0404  NA 140 139  K 4.3 4.1  CL 107 107  CO2 21* 22  GLUCOSE 108* 95  BUN 27* 24*  CREATININE 1.07 0.96  CALCIUM 9.0 8.7*   Liver Function Tests: No results for input(s): AST, ALT, ALKPHOS, BILITOT, PROT, ALBUMIN in the last 72 hours. No results for input(s): LIPASE, AMYLASE in the last 72 hours. No results for input(s): AMMONIA in the last 72 hours. CBC:  Recent Labs  07/24/15 1606 07/25/15 0404  WBC 9.2 7.2  HGB 11.6* 12.0*  HCT 35.6*  36.5*  MCV 91.5 90.6  PLT 180 193   Cardiac Enzymes: No results for input(s): CKTOTAL, CKMB, CKMBINDEX, TROPONINI in the last 72 hours. BNP: No results for input(s): PROBNP in the last 72 hours. D-Dimer: No results for input(s): DDIMER in the last 72 hours. CBG: No results for input(s): GLUCAP in the last 72 hours. Hemoglobin A1C: No results for input(s): HGBA1C in the last 72 hours. Fasting Lipid Panel: No results for input(s): CHOL, HDL, LDLCALC, TRIG, CHOLHDL, LDLDIRECT in the last 72 hours. Thyroid Function Tests: No results for input(s): TSH, T4TOTAL, FREET4, T3FREE, THYROIDAB in the last 72 hours. Anemia Panel: No results for input(s): VITAMINB12, FOLATE, FERRITIN, TIBC, IRON, RETICCTPCT in the last 72 hours. Coagulation: No results for input(s): LABPROT, INR in the last 72 hours. Urine Drug Screen: Drugs of Abuse  No results found for: LABOPIA, COCAINSCRNUR, LABBENZ, AMPHETMU, THCU, LABBARB  Alcohol Level: No results for input(s): ETH in the last 72 hours. Urinalysis: No results for input(s): COLORURINE, LABSPEC, PHURINE, GLUCOSEU, HGBUR, BILIRUBINUR, KETONESUR, PROTEINUR, UROBILINOGEN, NITRITE, LEUKOCYTESUR in the last 72 hours.  Invalid input(s): APPERANCEUR Misc. Labs: None Micro Results: No results found for this or any previous visit (from the past 240 hour(s)). Studies/Results: Dg Chest 2 View  07/24/2015  CLINICAL DATA:  Acute onset of left upper  back pain and shortness of breath. Initial encounter. EXAM: CHEST  2 VIEW COMPARISON:  CT of the chest performed 10/18/2010 FINDINGS: There is complete collapse of the left lung, with a left-sided pneumothorax involving the entire left hemithorax. No significant mediastinal shift is seen. Mild right basilar atelectasis is noted. No significant pleural effusion is identified. The cardiomediastinal silhouette is normal in size. No acute osseous abnormalities are seen. Degenerative change is noted at the left glenohumeral  joint, with chronic remodeling humeral head. IMPRESSION: Complete collapse of the left lung, with a left-sided pneumothorax involving the entire left hemithorax. Mild right basilar atelectasis noted. Critical Value/emergent results were called by telephone at the time of interpretation on 07/24/2015 at 6:10 am to Dr. Lajean Saver, who verbally acknowledged these results. Electronically Signed   By: Garald Balding M.D.   On: 07/24/2015 06:12   Dg Chest Port 1 View  07/25/2015  CLINICAL DATA:  Left bundle branch block, followup left chest tube EXAM: PORTABLE CHEST 1 VIEW COMPARISON:  07/24/2015 FINDINGS: Limited inspiratory effect. Stable mild cardiac silhouette enlargement. Right lung clear except for minimal atelectasis at the base. On the left, stable chest tube. No pneumothorax. Stable mild left base opacities. No pneumothorax identified. IMPRESSION: Stable left chest tube and bibasilar opacities Electronically Signed   By: Skipper Cliche M.D.   On: 07/25/2015 08:54   Dg Chest Portable 1 View  07/24/2015  CLINICAL DATA:  Two weeks of cough and congestion, new onset left-sided back pain and shortness of breath. Large left pneumothorax identified on chest x-ray from earlier today, status post chest tube placement. Nurse describes a large hematoma about the chest tube. EXAM: PORTABLE CHEST 1 VIEW COMPARISON:  Chest x-rays from earlier today. FINDINGS: Cardiomediastinal silhouette appears stable in size and configuration. Left-sided chest tube is stable in position with tip directed towards the left perihilar region. No pneumothorax seen. Probable mild bibasilar atelectasis. Lungs otherwise clear. No pleural effusion/hemothorax seen. Soft tissues about the chest are unremarkable, perhaps mild soft tissue prominence at the left-sided chest tube insertion site but not convincing. No osseous fracture or dislocation. Marked degenerative change again noted at the left shoulder. IMPRESSION: No change compared to the most  recent prior chest x-ray from earlier today. Left-sided chest tube remains stable in position with tip directed towards the left hilum. No pneumothorax seen. No pleural effusion/hemothorax seen. Perhaps mild soft tissue prominence at the chest tube insertion site but not convincing. Electronically Signed   By: Franki Cabot M.D.   On: 07/24/2015 18:26   Dg Chest Port 1 View  07/24/2015  CLINICAL DATA:  Status post chest tube insertion EXAM: PORTABLE CHEST 1 VIEW COMPARISON:  July 24, 2015 FINDINGS: A left chest tube has been inserted in the interval. The distal tip projects over the thoracic aorta/mediastinum. The left pneumothorax is no longer present. The lucency beneath the right hemidiaphragm was noted to represent an interposed loop of colon on the lateral view of the film from earlier today. The cardiomediastinal silhouette is stable. No other interval changes. IMPRESSION: Interval placement of left chest tube. See above for details. The left pneumothorax is no longer visualized. Electronically Signed   By: Dorise Bullion III M.D   On: 07/24/2015 07:31   Medications: I have reviewed the patient's current medications. Scheduled Meds: . fluticasone  2 spray Each Nare Daily  . sodium chloride flush  3 mL Intravenous Q12H   Continuous Infusions:  PRN Meds:.acetaminophen **OR** acetaminophen, dextromethorphan, HYDROcodone-acetaminophen, HYDROmorphone (DILAUDID) injection, polyethylene  glycol Assessment/Plan: Active Problems:   Spontaneous pneumothorax  Lawrence Hicks is a 82 y.o. M with PMHx including chronic LBBB and htn and remote smoking history who presented with 2 week history of cough and congestion and acute onset of left sided back pain and was found to have spontaneous left sided pneumothorax.   Spontaneous Pneumothorax -Chest tube in place. Pulmonology will follow and possibly remove tomorrow -Tylenol and hydrocodone PRN for pain -Continue 2L nasal cannula  Cough/Congestion Most  likely URI. Patient had influenza vaccine last fall. Influenza panel negative. -Continue Delsym  Hx of LBBB LBBB is stable from previous EKG.   Hx of HTN -continue home ramipril 2.5mg  daily  Dispo: Will admit to [facility] Medicine teaching service    This is a Medical Student Note.  The care of the patient was discussed with Dr. Randell Patient and the assessment and plan formulated with their assistance.  Please see their attached note for official documentation of the daily encounter.   LOS: 1 day   Domingo Madeira, Med Student 07/25/2015, 10:02 AM

## 2015-07-25 NOTE — Progress Notes (Signed)
Patient lying in bed, no pain, distress or needs expressed at this time. Is somewhat anxious about the chest tube being removed and this happening again. Encouraged him to share his concerns with the physician. Call light within reach

## 2015-07-26 ENCOUNTER — Inpatient Hospital Stay (HOSPITAL_COMMUNITY): Payer: Medicare Other

## 2015-07-26 DIAGNOSIS — Z789 Other specified health status: Secondary | ICD-10-CM

## 2015-07-26 DIAGNOSIS — J939 Pneumothorax, unspecified: Secondary | ICD-10-CM

## 2015-07-26 DIAGNOSIS — Z4682 Encounter for fitting and adjustment of non-vascular catheter: Secondary | ICD-10-CM | POA: Insufficient documentation

## 2015-07-26 DIAGNOSIS — R05 Cough: Secondary | ICD-10-CM

## 2015-07-26 DIAGNOSIS — R0902 Hypoxemia: Secondary | ICD-10-CM
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
## 2015-07-26 NOTE — Progress Notes (Signed)
eLink Physician-Brief Progress Note Patient Name: Lawrence Hicks DOB: 04/24/1924 MRN: [id number]   Date of Service  07/26/2015  HPI/Events of Note  Portable chest x-ray post left chest tube removal reviewed.No evidence of residual pneumothorax.  eICU Interventions  Chest x-ray already ordered for the morning.     Intervention Category Intermediate Interventions: Diagnostic test evaluation  Tera Partridge 07/26/2015, 9:16 PM

## 2015-07-26 NOTE — Progress Notes (Signed)
Name: Blaike Paduano MRN: [id number] DOB: 1924-03-26    ADMISSION DATE:  07/24/2015 CONSULTATION DATE:  3/4  REFERRING MD :  IMTS   CHIEF COMPLAINT:  ptx   BRIEF PATIENT DESCRIPTION: 82yo male with hx HTN, LBBB presented 3/4 with 2 week hx cough and congestion and new onset L sided back pain and SOB which woke him at 3am.  He was hypoxic on admit with sats 70's on RA.  CXR revealed large L ptx and chest tube was placed in ER.  PCCM asked to assist with chest tube management.   SIGNIFICANT EVENTS  3/4>>admit, large L ptx, CT placed in ER  STUDIES:    SUBJECTIVE:  No pain, no sob  VITAL SIGNS: Temp:  [97.8 F (36.6 C)-98.6 F (37 C)] 98 F (36.7 C) (03/06 0413) Pulse Rate:  [75-110] 90 (03/06 0413) Resp:  [16] 16 (03/06 0413) BP: (123-170)/(54-78) 155/78 mmHg (03/06 0413) SpO2:  [90 %-99 %] 94 % (03/06 0413)  PHYSICAL EXAMINATION: General:  Pleasant elderly male, NAD  Neuro:  Awake, alert, appropriate, MAE HEENT:  Mm moist, no JVD , no crepitis Cardiovascular:  s1s2 rrr not tachy Lungs: CTA, no leak, no cough Abdomen:  Soft, +bs  Musculoskeletal:  Warm and dry, no edema   Recent Labs Lab 07/20/15 1130 07/24/15 0520 07/25/15 0404  NA 139 140 139  K 4.6 4.3 4.1  CL 102 107 107  CO2 23 21* 22  BUN 25 27* 24*  CREATININE 0.98 1.07 0.96  GLUCOSE 104* 108* 95    Recent Labs Lab 07/24/15 0520 07/24/15 1606 07/25/15 0404  HGB 13.4 11.6* 12.0*  HCT 39.4 35.6* 36.5*  WBC 6.6 9.2 7.2  PLT 181 180 193   Dg Chest Port 1 View  07/26/2015  CLINICAL DATA:  Chest tube. EXAM: PORTABLE CHEST 1 VIEW COMPARISON:  07/25/2015. FINDINGS: Left chest tube in stable position. Mediastinum hilar structures normal. Low lung volumes with basilar atelectasis. No pleural effusion or pneumothorax. IMPRESSION: 1.  Left chest tube in stable position.  No pneumothorax. 2. Low lung volumes with mild bibasilar subsegmental atelectasis. Electronically Signed   By: Marcello Moores  Register   On:  07/26/2015 07:31   Dg Chest Port 1 View  07/25/2015  CLINICAL DATA:  Left bundle branch block, followup left chest tube EXAM: PORTABLE CHEST 1 VIEW COMPARISON:  07/24/2015 FINDINGS: Limited inspiratory effect. Stable mild cardiac silhouette enlargement. Right lung clear except for minimal atelectasis at the base. On the left, stable chest tube. No pneumothorax. Stable mild left base opacities. No pneumothorax identified. IMPRESSION: Stable left chest tube and bibasilar opacities Electronically Signed   By: Skipper Cliche M.D.   On: 07/25/2015 08:54   Dg Chest Portable 1 View  07/24/2015  CLINICAL DATA:  Two weeks of cough and congestion, new onset left-sided back pain and shortness of breath. Large left pneumothorax identified on chest x-ray from earlier today, status post chest tube placement. Nurse describes a large hematoma about the chest tube. EXAM: PORTABLE CHEST 1 VIEW COMPARISON:  Chest x-rays from earlier today. FINDINGS: Cardiomediastinal silhouette appears stable in size and configuration. Left-sided chest tube is stable in position with tip directed towards the left perihilar region. No pneumothorax seen. Probable mild bibasilar atelectasis. Lungs otherwise clear. No pleural effusion/hemothorax seen. Soft tissues about the chest are unremarkable, perhaps mild soft tissue prominence at the left-sided chest tube insertion site but not convincing. No osseous fracture or dislocation. Marked degenerative change again noted at the left shoulder. IMPRESSION:  No change compared to the most recent prior chest x-ray from earlier today. Left-sided chest tube remains stable in position with tip directed towards the left hilum. No pneumothorax seen. No pleural effusion/hemothorax seen. Perhaps mild soft tissue prominence at the chest tube insertion site but not convincing. Electronically Signed   By: Franki Cabot M.D.   On: 07/24/2015 18:26    ASSESSMENT / PLAN:  Spontaneous L sided pneumothorax -- s/p  chest tube placement 3/4.  ??r/t significant coughing over last 2 weeks in setting likely viral URI.   PLAN -  On water seal over 24 hrs,  NO leak, pcxr neg for ptx No leak with cough Comfortable appearing Dc ct repeat pcxr in 4 hours Repeat pcxr in am    Lavon Paganini. Titus Mould, MD, [place] Pgr: [facility]

## 2015-07-26 NOTE — Progress Notes (Signed)
Subjective: [name] was agitated briefly overnight and removed all his leads. However his chest tube remained in place. He had no other acute overnight events. His congestion and cough continues to resolve and he has had no SOB. He also denies fevers and chest pain. Objective: Vital signs in last 24 hours: Filed Vitals:   07/25/15 1409 07/25/15 1413 07/25/15 2054 07/26/15 0413  BP: 123/67  170/54 155/78  Pulse: 84 110 75 90  Temp: 97.8 F (36.6 C)  98.6 F (37 C) 98 F (36.7 C)  TempSrc: Oral  Oral Oral  Resp: 16  16 16   Height:      Weight:      SpO2: 99% 90% 96% 94%   Weight change:   Intake/Output Summary (Last 24 hours) at 07/26/15 1056 Last data filed at 07/26/15 0900  Gross per 24 hour  Intake    360 ml  Output   1030 ml  Net   -670 ml   General appearance: alert, cooperative, appears stated age and no distress Head: Normocephalic, without obvious abnormality, atraumatic, sinuses nontender to percussion Back: Chest tube present over left lower lobe. no tenderness to palpation Lungs: clear to auscultation bilaterally and normal respiratory effort Chest wall: No tenderness to palpation Heart: RRR with nl s1 and s2 Extremities: extremities normal, atraumatic, no cyanosis  Lab Results: Basic Metabolic Panel:  Recent Labs  07/24/15 0520 07/25/15 0404  NA 140 139  K 4.3 4.1  CL 107 107  CO2 21* 22  GLUCOSE 108* 95  BUN 27* 24*  CREATININE 1.07 0.96  CALCIUM 9.0 8.7*   Liver Function Tests: No results for input(s): AST, ALT, ALKPHOS, BILITOT, PROT, ALBUMIN in the last 72 hours. No results for input(s): LIPASE, AMYLASE in the last 72 hours. No results for input(s): AMMONIA in the last 72 hours. CBC:  Recent Labs  07/24/15 1606 07/25/15 0404  WBC 9.2 7.2  HGB 11.6* 12.0*  HCT 35.6* 36.5*  MCV 91.5 90.6  PLT 180 193   Cardiac Enzymes: No results for input(s): CKTOTAL, CKMB, CKMBINDEX, TROPONINI in the last 72 hours. BNP: No results for input(s):  PROBNP in the last 72 hours. D-Dimer: No results for input(s): DDIMER in the last 72 hours. CBG: No results for input(s): GLUCAP in the last 72 hours. Hemoglobin A1C: No results for input(s): HGBA1C in the last 72 hours. Fasting Lipid Panel: No results for input(s): CHOL, HDL, LDLCALC, TRIG, CHOLHDL, LDLDIRECT in the last 72 hours. Thyroid Function Tests: No results for input(s): TSH, T4TOTAL, FREET4, T3FREE, THYROIDAB in the last 72 hours. Anemia Panel: No results for input(s): VITAMINB12, FOLATE, FERRITIN, TIBC, IRON, RETICCTPCT in the last 72 hours. Coagulation: No results for input(s): LABPROT, INR in the last 72 hours. Urine Drug Screen: Drugs of Abuse  No results found for: LABOPIA, COCAINSCRNUR, LABBENZ, AMPHETMU, THCU, LABBARB  Alcohol Level: No results for input(s): ETH in the last 72 hours. Urinalysis: No results for input(s): COLORURINE, LABSPEC, PHURINE, GLUCOSEU, HGBUR, BILIRUBINUR, KETONESUR, PROTEINUR, UROBILINOGEN, NITRITE, LEUKOCYTESUR in the last 72 hours.  Invalid input(s): APPERANCEUR Misc. Labs: None Micro Results: Recent Results (from the past 240 hour(s))  Blood culture (routine x 2)     Status: None (Preliminary result)   Collection Time: 07/24/15  6:22 AM  Result Value Ref Range Status   Specimen Description BLOOD RIGHT FOREARM  Final   Special Requests BOTTLES DRAWN AEROBIC AND ANAEROBIC 5ML  Final   Culture NO GROWTH 1 DAY  Final   Report Status  PENDING  Incomplete  Blood culture (routine x 2)     Status: None (Preliminary result)   Collection Time: 07/24/15  6:29 AM  Result Value Ref Range Status   Specimen Description BLOOD RIGHT HAND  Final   Special Requests BOTTLES DRAWN AEROBIC AND ANAEROBIC 5ML  Final   Culture NO GROWTH 1 DAY  Final   Report Status PENDING  Incomplete   Studies/Results: Dg Chest Port 1 View  07/26/2015  CLINICAL DATA:  Chest tube. EXAM: PORTABLE CHEST 1 VIEW COMPARISON:  07/25/2015. FINDINGS: Left chest tube in stable  position. Mediastinum hilar structures normal. Low lung volumes with basilar atelectasis. No pleural effusion or pneumothorax. IMPRESSION: 1.  Left chest tube in stable position.  No pneumothorax. 2. Low lung volumes with mild bibasilar subsegmental atelectasis. Electronically Signed   By: Marcello Moores  Register   On: 07/26/2015 07:31   Dg Chest Port 1 View  07/25/2015  CLINICAL DATA:  Left bundle branch block, followup left chest tube EXAM: PORTABLE CHEST 1 VIEW COMPARISON:  07/24/2015 FINDINGS: Limited inspiratory effect. Stable mild cardiac silhouette enlargement. Right lung clear except for minimal atelectasis at the base. On the left, stable chest tube. No pneumothorax. Stable mild left base opacities. No pneumothorax identified. IMPRESSION: Stable left chest tube and bibasilar opacities Electronically Signed   By: Skipper Cliche M.D.   On: 07/25/2015 08:54   Dg Chest Portable 1 View  07/24/2015  CLINICAL DATA:  Two weeks of cough and congestion, new onset left-sided back pain and shortness of breath. Large left pneumothorax identified on chest x-ray from earlier today, status post chest tube placement. Nurse describes a large hematoma about the chest tube. EXAM: PORTABLE CHEST 1 VIEW COMPARISON:  Chest x-rays from earlier today. FINDINGS: Cardiomediastinal silhouette appears stable in size and configuration. Left-sided chest tube is stable in position with tip directed towards the left perihilar region. No pneumothorax seen. Probable mild bibasilar atelectasis. Lungs otherwise clear. No pleural effusion/hemothorax seen. Soft tissues about the chest are unremarkable, perhaps mild soft tissue prominence at the left-sided chest tube insertion site but not convincing. No osseous fracture or dislocation. Marked degenerative change again noted at the left shoulder. IMPRESSION: No change compared to the most recent prior chest x-ray from earlier today. Left-sided chest tube remains stable in position with tip directed  towards the left hilum. No pneumothorax seen. No pleural effusion/hemothorax seen. Perhaps mild soft tissue prominence at the chest tube insertion site but not convincing. Electronically Signed   By: Franki Cabot M.D.   On: 07/24/2015 18:26   Medications: I have reviewed the patient's current medications. Scheduled Meds: . fluticasone  2 spray Each Nare Daily  . sodium chloride flush  3 mL Intravenous Q12H   Continuous Infusions:  PRN Meds:.acetaminophen **OR** acetaminophen, dextromethorphan, HYDROcodone-acetaminophen, HYDROmorphone (DILAUDID) injection, polyethylene glycol Assessment/Plan: Active Problems:   Spontaneous pneumothorax   Chest tube in place   Pneumothorax, left   URI (upper respiratory infection)   Cough   Hypoxia   Pneumothorax  [name] is a 82 y.o. M with PMHx including chronic LBBB and htn and remote smoking history who presented with 2 week history of cough and congestion and acute onset of left sided back pain and was found to have spontaneous left sided pneumothorax.   Spontaneous Pneumothorax. CXR this morning shows resolution of pneumothorax. Lungs are inflated bilaterally. -Chest tube in place with water seal. Will speak with pulmonology about next steps in management -Tylenol and hydrocodone PRN for pain -Continue 2L  nasal cannula  Cough/Congestion Most likely URI. Patient had influenza vaccine last fall. Influenza panel negative. -Continue Delsym  Hx of LBBB LBBB is stable from previous EKG.  -D/C continuous cardiac monitoring  Hx of HTN -continue home ramipril 2.5mg  daily  Dispo: Possibly home tomorrow after d/c of chest tube  This is a Careers information officer Note.  The care of the patient was discussed with Dr. Randell Patient and the assessment and plan formulated with their assistance.  Please see their attached note for official documentation of the daily encounter.   LOS: 2 days   Domingo Madeira, Med Student 07/26/2015, 10:56 AM

## 2015-07-26 NOTE — Evaluation (Signed)
Occupational Therapy Evaluation Patient Details Name: Lawrence Hicks MRN: [id number] DOB: [birth year] Today's Date: 07/26/2015    History of Present Illness 82 yo admitted with left spontaneous PTX. PMhx: LBBB, HTN   Clinical Impression   Pt limiting activity and refusing to ambulate, perform grooming at sink or get up to chair despite education in importance.  Pt offended by PT's suggestion that he would benefit from post acute therapy during evaluation yesterday. Recommended pt consider a medical alert system for home as he lives alone. Pt is agreeable to continuing to work with OT while in the hospital.  He believes he will be able to function as his baseline once he gets the chest tube out and returns home. Will follow.    Follow Up Recommendations   (pt is declining post acute therapy)    Equipment Recommendations       Recommendations for Other Services       Precautions / Restrictions Precautions Precautions: Fall Precaution Comments: chest tube Restrictions Weight Bearing Restrictions: No      Mobility Bed Mobility   Bed Mobility: Supine to Sit;Sit to Supine     Supine to sit: Min guard Sit to supine: Supervision   General bed mobility comments: increased time, use of rail, min guard for balance  Transfers     Transfers: Sit to/from Stand Sit to Stand: Min guard              Balance     Sitting balance-Leahy Scale: Fair       Standing balance-Leahy Scale: Poor                              ADL Overall ADL's : Needs assistance/impaired Eating/Feeding: Independent;Bed level   Grooming: Brushing hair;Supervision/safety;Sitting   Upper Body Bathing: Set up;Sitting   Lower Body Bathing: Supervison/ safety;Sit to/from stand   Upper Body Dressing : Set up;Sitting   Lower Body Dressing: Supervision/safety;Sit to/from stand                 General ADL Comments: Pt refused to ambulate or transfer to chair.     Vision      Perception     Praxis      Pertinent Vitals/Pain Pain Assessment: No/denies pain     Hand Dominance Right   Extremity/Trunk Assessment Upper Extremity Assessment Upper Extremity Assessment: Generalized weakness   Lower Extremity Assessment Lower Extremity Assessment: Generalized weakness   Cervical / Trunk Assessment Cervical / Trunk Assessment: Kyphotic   Communication Communication Communication: HOH   Cognition Arousal/Alertness: Awake/alert Behavior During Therapy: WFL for tasks assessed/performed Overall Cognitive Status: Within Functional Limits for tasks assessed Area of Impairment: Safety/judgement         Safety/Judgement: Decreased awareness of deficits     General Comments: educated pt in importance of OOB activity   General Comments       Exercises       Shoulder Instructions      Home Living Family/patient expects to be discharged to:: Private residence Living Arrangements: Alone Available Help at Discharge: Friend(s);Available PRN/intermittently Type of Home: House       Home Layout: One level     Bathroom Shower/Tub: Occupational psychologist: Standard     Home Equipment: Cane - single point          Prior Functioning/Environment Level of Independence: Independent with assistive device(s)  Gait / Transfers Assistance Needed: pt walks with  a cane out of the house, nothing inside, takes out his own trash cans  ADL's / Homemaking Assistance Needed: independent with ADLs has neighbor drive him to appointments and shopping        OT Diagnosis: Generalized weakness   OT Problem List: Decreased strength;Impaired balance (sitting and/or standing);Decreased safety awareness;Decreased knowledge of use of DME or AE   OT Treatment/Interventions: Self-care/ADL training;Patient/family education;DME and/or AE instruction;Balance training    OT Goals(Current goals can be found in the care plan section) Acute Rehab OT  Goals Patient Stated Goal: get chest tube out and go home tomorrow OT Goal Formulation: With patient Time For Goal Achievement: 08/02/15 Potential to Achieve Goals: Good ADL Goals Pt Will Perform Grooming: with modified independence;standing Pt Will Perform Lower Body Bathing: with modified independence;sit to/from stand Pt Will Perform Lower Body Dressing: with modified independence;sit to/from stand Pt Will Transfer to Toilet: with modified independence;ambulating;regular height toilet Pt Will Perform Toileting - Clothing Manipulation and hygiene: with modified independence;sit to/from stand Pt Will Perform Tub/Shower Transfer: Shower transfer;with modified independence;ambulating Additional ADL Goal #1: Pt will perform bed mobility independently.  OT Frequency: Min 2X/week   Barriers to D/C: Decreased caregiver support          Co-evaluation              End of Session Nurse Communication:  (pt encouraged to walk to bathroom, sit up in chair for meals)  Activity Tolerance:  (pt self limiting) Patient left: in bed;with call bell/phone within reach;with nursing/sitter in room   Time: 0935-1016 OT Time Calculation (min): 41 min Charges:  OT General Charges $OT Visit: 1 Procedure OT Evaluation $OT Eval Moderate Complexity: 1 Procedure OT Treatments $Self Care/Home Management : 23-37 mins G-Codes:    Malka So 07/26/2015, 10:25 AM  [phone]

## 2015-07-26 NOTE — Progress Notes (Signed)
Internal Medicine Attending  Date: 07/26/2015  Patient name: Lawrence Hicks Medical record number: [id number] Date of birth: 10/08/1923 Age: 82 y.o. Gender: male  I saw and evaluated the patient. I reviewed the resident's note by Dr. Randell Patient and I agree with the resident's findings and plans as documented in her progress note.  When seen on rounds this morning Lawrence Hicks was comfortable without any complaints. He had been on waterseal for over 24 hours and there was no airleak with spontaneous respirations or cough. Portable chest x-ray was also without evidence of a recurrent pneumothorax. Pulmonary is removing the chest tube and we will repeat the chest x-ray this evening and again in the morning. Assuming no recurrent pneumothorax this evening or in the morning he should be stable for discharge home. I suspect the spontaneous pneumothorax was related to excessive coughing from a recent upper respiratory infection. I do not believe it's due to any structural defect within the lung as a CT scan of the chest previously demonstrated absolutely no bullous disease radiographically on the left.

## 2015-07-26 NOTE — Progress Notes (Signed)
Subjective: Episode of confusion overnight. He reports he is doing well this morning. Denies pain. No chest pain or dyspnea. No new complaints.  Objective: Vital signs in last 24 hours: Filed Vitals:   07/25/15 1409 07/25/15 1413 07/25/15 2054 07/26/15 0413  BP: 123/67  170/54 155/78  Pulse: 84 110 75 90  Temp: 97.8 F (36.6 C)  98.6 F (37 C) 98 F (36.7 C)  TempSrc: Oral  Oral Oral  Resp: 16  16 16   Height:      Weight:      SpO2: 99% 90% 96% 94%   Weight change:   Intake/Output Summary (Last 24 hours) at 07/26/15 1058 Last data filed at 07/26/15 0900  Gross per 24 hour  Intake    360 ml  Output   1030 ml  Net   -670 ml   General Apperance: NAD HEENT: Normocephalic, atraumatic, anicteric sclera Neck: Supple, trachea midline Lungs: Clear to auscultation bilaterally. No wheezes, rhonchi or rales. Left chest tube in place, no air leak. Heart: Regular rate and rhythm, no murmur/rub/gallop Abdomen: Soft, nontender, nondistended, no rebound/guarding Extremities: Warm and well perfused, no edema Skin: No rashes or lesions Neurologic: Alert and interactive. No gross deficits.  Lab Results: Basic Metabolic Panel:  Recent Labs Lab 07/24/15 0520 07/25/15 0404  NA 140 139  K 4.3 4.1  CL 107 107  CO2 21* 22  GLUCOSE 108* 95  BUN 27* 24*  CREATININE 1.07 0.96  CALCIUM 9.0 8.7*   Liver Function Tests:  Recent Labs Lab 07/20/15 1130  AST 17  ALT 12  ALKPHOS 105  BILITOT 0.5  PROT 6.8  ALBUMIN 4.2   CBC:  Recent Labs Lab 07/20/15 1130  07/24/15 1606 07/25/15 0404  WBC 9.7  < > 9.2 7.2  NEUTROABS 7.5  --   --   --   HGB 13.8  < > 11.6* 12.0*  HCT 40.6  < > 35.6* 36.5*  MCV 89.4  < > 91.5 90.6  PLT 234  < > 180 193  < > = values in this interval not displayed.  Micro Results: Recent Results (from the past 240 hour(s))  Blood culture (routine x 2)     Status: None (Preliminary result)   Collection Time: 07/24/15  6:22 AM  Result Value Ref Range  Status   Specimen Description BLOOD RIGHT FOREARM  Final   Special Requests BOTTLES DRAWN AEROBIC AND ANAEROBIC 5ML  Final   Culture NO GROWTH 1 DAY  Final   Report Status PENDING  Incomplete  Blood culture (routine x 2)     Status: None (Preliminary result)   Collection Time: 07/24/15  6:29 AM  Result Value Ref Range Status   Specimen Description BLOOD RIGHT HAND  Final   Special Requests BOTTLES DRAWN AEROBIC AND ANAEROBIC 5ML  Final   Culture NO GROWTH 1 DAY  Final   Report Status PENDING  Incomplete     Studies/Results: Dg Chest Port 1 View  07/26/2015  CLINICAL DATA:  Chest tube. EXAM: PORTABLE CHEST 1 VIEW COMPARISON:  07/25/2015. FINDINGS: Left chest tube in stable position. Mediastinum hilar structures normal. Low lung volumes with basilar atelectasis. No pleural effusion or pneumothorax. IMPRESSION: 1.  Left chest tube in stable position.  No pneumothorax. 2. Low lung volumes with mild bibasilar subsegmental atelectasis. Electronically Signed   By: Cavalier   On: 07/26/2015 07:31   Dg Chest Port 1 View  07/25/2015  CLINICAL DATA:  Left bundle branch block,  followup left chest tube EXAM: PORTABLE CHEST 1 VIEW COMPARISON:  07/24/2015 FINDINGS: Limited inspiratory effect. Stable mild cardiac silhouette enlargement. Right lung clear except for minimal atelectasis at the base. On the left, stable chest tube. No pneumothorax. Stable mild left base opacities. No pneumothorax identified. IMPRESSION: Stable left chest tube and bibasilar opacities Electronically Signed   By: Skipper Cliche M.D.   On: 07/25/2015 08:54   Dg Chest Portable 1 View  07/24/2015  CLINICAL DATA:  Two weeks of cough and congestion, new onset left-sided back pain and shortness of breath. Large left pneumothorax identified on chest x-ray from earlier today, status post chest tube placement. Nurse describes a large hematoma about the chest tube. EXAM: PORTABLE CHEST 1 VIEW COMPARISON:  Chest x-rays from earlier today.  FINDINGS: Cardiomediastinal silhouette appears stable in size and configuration. Left-sided chest tube is stable in position with tip directed towards the left perihilar region. No pneumothorax seen. Probable mild bibasilar atelectasis. Lungs otherwise clear. No pleural effusion/hemothorax seen. Soft tissues about the chest are unremarkable, perhaps mild soft tissue prominence at the left-sided chest tube insertion site but not convincing. No osseous fracture or dislocation. Marked degenerative change again noted at the left shoulder. IMPRESSION: No change compared to the most recent prior chest x-ray from earlier today. Left-sided chest tube remains stable in position with tip directed towards the left hilum. No pneumothorax seen. No pleural effusion/hemothorax seen. Perhaps mild soft tissue prominence at the chest tube insertion site but not convincing. Electronically Signed   By: Franki Cabot M.D.   On: 07/24/2015 18:26   Medications: I have reviewed the patient's current medications. Scheduled Meds: . fluticasone  2 spray Each Nare Daily  . sodium chloride flush  3 mL Intravenous Q12H   Continuous Infusions:  PRN Meds:.acetaminophen **OR** acetaminophen, dextromethorphan, HYDROcodone-acetaminophen, HYDROmorphone (DILAUDID) injection, polyethylene glycol Assessment/Plan: 82 year old man with history of LBBB, HTN presenting with left upper back pain and dyspnea.  Spontaneous pneumothorax: Repeat CXR 3/6 stable. -Pulmonary following, appreciate recommendations. -Supplemental oxygen -CT to water seal presently. Likely will be d/c'd today by pulm - will follow up their recommendations. -Pain meds prn -Pulm toilet with IS  Viral URI: Flu panel negative.  -Flonase daily -Dextromethorphan BID prn cough -Supportive care  HTN: Continue home ramipril.  Delirium: likely related to being in the hospital setting. Mental status at baseline this morning. Will discontinue telemetry.  VTE ppx:  Lovenox  Code: FULL   Dispo: Disposition is deferred at this time, awaiting improvement of current medical problems.  Anticipated discharge in approximately 1 day(s).   The patient does not have a current PCP (No Pcp Per Patient) and does not need an [facility] hospital follow-up appointment after discharge.  The patient does not have transportation limitations that hinder transportation to clinic appointments.  .Services Needed at time of discharge: Y = Yes, Blank = No PT:   OT:   RN:   Equipment:   Other:     LOS: 2 days   Milagros Loll, MD 07/26/2015, 10:58 AM

## 2015-07-26 NOTE — Progress Notes (Signed)
D/C'd chest tube per order and per protocol. Pt tolerated well. Pt states no needs at this time. Will continue to monitor.

## 2015-07-27 ENCOUNTER — Telehealth: Payer: Self-pay | Admitting: Internal Medicine

## 2015-07-27 ENCOUNTER — Inpatient Hospital Stay (HOSPITAL_COMMUNITY): Payer: Medicare Other

## 2015-07-27 ENCOUNTER — Encounter (HOSPITAL_COMMUNITY): Payer: Self-pay

## 2015-07-27 DIAGNOSIS — Z8679 Personal history of other diseases of the circulatory system: Secondary | ICD-10-CM

## 2015-07-27 DIAGNOSIS — D649 Anemia, unspecified: Secondary | ICD-10-CM

## 2015-07-27 MED ORDER — DEXTROMETHORPHAN POLISTIREX ER 30 MG/5ML PO SUER: 30.0000 mg | Freq: Two times a day (BID) | ORAL | Status: DC | PRN

## 2015-07-27 NOTE — Progress Notes (Signed)
Name: Lawrence Hicks MRN: [id number] DOB: December 17, 1923    ADMISSION DATE:  07/24/2015 CONSULTATION DATE:  3/4  REFERRING MD :  IMTS   CHIEF COMPLAINT:  ptx   BRIEF PATIENT DESCRIPTION: 82yo male with hx HTN, LBBB presented 3/4 with 2 week hx cough and congestion and new onset L sided back pain and SOB which woke him at 3am.  He was hypoxic on admit with sats 70's on RA.  CXR revealed large L ptx and chest tube was placed in ER.  PCCM asked to assist with chest tube management.   SIGNIFICANT EVENTS  3/4>>admit, large L ptx, CT placed in ER 3/6 chest tube removed 3/7 lung remains inflated  STUDIES:    SUBJECTIVE:  No pain, no sob, congestion chronic  VITAL SIGNS: Temp:  [97.6 F (36.4 C)-99.2 F (37.3 C)] 99.2 F (37.3 C) (03/07 0429) Pulse Rate:  [79-90] 83 (03/07 0429) Resp:  [16-18] 18 (03/07 0429) BP: (105-156)/(48-77) 105/50 mmHg (03/07 0429) SpO2:  [93 %-97 %] 96 % (03/07 0429)  PHYSICAL EXAMINATION: General:  Pleasant elderly male, NAD , co cough and congestion Neuro:  Awake, alert, appropriate, MAE HEENT:  Mm moist, no JVD , no crepitis Cardiovascular:  s1s2 rrr hr 75 Lungs: CTA,  Mild cough, chest tube out, site unremakable Abdomen:  Soft, +bs  Musculoskeletal:  Warm and dry, no edema   Recent Labs Lab 07/20/15 1130 07/24/15 0520 07/25/15 0404  NA 139 140 139  K 4.6 4.3 4.1  CL 102 107 107  CO2 23 21* 22  BUN 25 27* 24*  CREATININE 0.98 1.07 0.96  GLUCOSE 104* 108* 95    Recent Labs Lab 07/24/15 0520 07/24/15 1606 07/25/15 0404  HGB 13.4 11.6* 12.0*  HCT 39.4 35.6* 36.5*  WBC 6.6 9.2 7.2  PLT 181 180 193   Dg Chest Port 1 View  07/27/2015  CLINICAL DATA:  Follow-up spontaneous left-sided pneumothorax treated with chest tube, URI symptoms, former smoker. EXAM: PORTABLE CHEST 1 VIEW COMPARISON:  Portable chest x-ray of July 26, 2015 FINDINGS: The lungs remain hypoinflated. There is no pneumothorax evident today. The chest tube is been removed.  Subsegmental atelectasis at the left lung base persists. No significant atelectasis on the right is observed today. The cardiac silhouette is largely obscured but remains enlarged. The pulmonary vascularity is normal. The mediastinum is normal in width. The observed bony thorax exhibits no acute abnormality. There is severe degenerative change of the left shoulder. IMPRESSION: Persistent bilateral hypo inflation. Basilar atelectasis on the left is stable. There is no pneumothorax. Electronically Signed   By: David  Martinique M.D.   On: 07/27/2015 07:41   Dg Chest Port 1 View  07/26/2015  CLINICAL DATA:  82 year old male with history of spontaneous pneumothorax on 07/24/2015 status post chest tube removal this morning. Followup study. EXAM: PORTABLE CHEST 1 VIEW COMPARISON:  Chest x-ray a 07/26/2015. FINDINGS: Previously noted left-sided chest tube has been removed. No appreciable left pneumothorax identified at this time. Lung volumes are very low and there are bibasilar opacities which are presumably areas of subsegmental atelectasis. No definite acute consolidative airspace disease. No pleural effusions. No evidence of pulmonary edema. Heart size appears normal. Upper mediastinal contours are within normal limits allowing for patient positioning. Atherosclerosis in the thoracic aorta. IMPRESSION: 1. Status post left chest tube removal.  No pneumothorax. 2. Low lung volumes with bibasilar areas of subsegmental atelectasis. 3. Atherosclerosis. Electronically Signed   By: Vinnie Langton M.D.   On: 07/26/2015  18:22   Dg Chest Port 1 View  07/26/2015  CLINICAL DATA:  Chest tube. EXAM: PORTABLE CHEST 1 VIEW COMPARISON:  07/25/2015. FINDINGS: Left chest tube in stable position. Mediastinum hilar structures normal. Low lung volumes with basilar atelectasis. No pleural effusion or pneumothorax. IMPRESSION: 1.  Left chest tube in stable position.  No pneumothorax. 2. Low lung volumes with mild bibasilar subsegmental  atelectasis. Electronically Signed   By: Marcello Moores  Register   On: 07/26/2015 07:31    ASSESSMENT / PLAN:  Spontaneous L sided pneumothorax -- s/p chest tube placement 3/4.  ??r/t significant coughing over last 2 weeks in setting likely viral URI.   PLAN -  Chest tube dc'd on 3/6. CxR 3/7 with NO pnx.  PCCM will sign off 3/7   [facility] Minor ACNP Maryanna Shape PCCM Pager [phone] till 3 pm If no answer page [phone] 07/27/2015, 8:57 AM  STAFF NOTE: I, Merrie Roof, MD FACP have personally reviewed patient's available data, including medical history, events of note, physical examination and test results as part of my evaluation. I have discussed with resident/NP and other care providers such as pharmacist, RN and RRT. In addition, I personally evaluated patient and elicited key findings of: no distress, CT removed day prior, pcxr x 2 no ptx, walking with PT well, Ambulate further, I also discussed causes of spont PTX, coughing spells  Hard sneezing etc with him, he has dust exposure, he needs to avoid  Signing off   Lavon Paganini. Titus Mould, MD, [place] Pgr: [facility] 07/27/2015 10:49 AM

## 2015-07-27 NOTE — Progress Notes (Signed)
Subjective: Lawrence Hicks did well overnight with no acute events or complaints. His chest tube was removed yesterday. He denies any difficulty breathing or back pain overnight.  Objective: Vital signs in last 24 hours: Filed Vitals:   07/26/15 0413 07/26/15 1500 07/26/15 2113 07/27/15 0429  BP: 155/78 156/77 115/48 105/50  Pulse: 90 90 79 83  Temp: 98 F (36.7 C) 98.2 F (36.8 C) 97.6 F (36.4 C) 99.2 F (37.3 C)  TempSrc: Oral Oral Oral Oral  Resp: 16 16 18 18   Height:      Weight:      SpO2: 94% 97% 93% 96%   Weight change:   Intake/Output Summary (Last 24 hours) at 07/27/15 1024 Last data filed at 07/27/15 0200  Gross per 24 hour  Intake    240 ml  Output    800 ml  Net   -560 ml   BP 105/50 mmHg  Pulse 83  Temp(Src) 99.2 F (37.3 C) (Oral)  Resp 18  Ht 5\' 7"  (1.702 m)  Wt 68.04 kg (150 lb)  BMI 23.49 kg/m2  SpO2 96% General appearance: alert, cooperative, appears stated age and no distress Head: Normocephalic, without obvious abnormality, atraumatic Back: Dressing present over previous chest tube site. Lungs: clear to auscultation bilaterally and normal respiratory effort Chest wall: No tenderness to palpation Heart: RRR with nl s1 and s2. s3 vs. Split s2 heard intermittently  Extremities: extremities normal, atraumatic, no cyanosis Lab Results: Basic Metabolic Panel:  Recent Labs  07/25/15 0404  NA 139  K 4.1  CL 107  CO2 22  GLUCOSE 95  BUN 24*  CREATININE 0.96  CALCIUM 8.7*   Liver Function Tests: No results for input(s): AST, ALT, ALKPHOS, BILITOT, PROT, ALBUMIN in the last 72 hours. No results for input(s): LIPASE, AMYLASE in the last 72 hours. No results for input(s): AMMONIA in the last 72 hours. CBC:  Recent Labs  07/24/15 1606 07/25/15 0404  WBC 9.2 7.2  HGB 11.6* 12.0*  HCT 35.6* 36.5*  MCV 91.5 90.6  PLT 180 193   Cardiac Enzymes: No results for input(s): CKTOTAL, CKMB, CKMBINDEX, TROPONINI in the last 72 hours. BNP: No  results for input(s): PROBNP in the last 72 hours. D-Dimer: No results for input(s): DDIMER in the last 72 hours. CBG: No results for input(s): GLUCAP in the last 72 hours. Hemoglobin A1C: No results for input(s): HGBA1C in the last 72 hours. Fasting Lipid Panel: No results for input(s): CHOL, HDL, LDLCALC, TRIG, CHOLHDL, LDLDIRECT in the last 72 hours. Thyroid Function Tests: No results for input(s): TSH, T4TOTAL, FREET4, T3FREE, THYROIDAB in the last 72 hours. Anemia Panel: No results for input(s): VITAMINB12, FOLATE, FERRITIN, TIBC, IRON, RETICCTPCT in the last 72 hours. Coagulation: No results for input(s): LABPROT, INR in the last 72 hours. Urine Drug Screen: Drugs of Abuse  No results found for: LABOPIA, COCAINSCRNUR, LABBENZ, AMPHETMU, THCU, LABBARB  Alcohol Level: No results for input(s): ETH in the last 72 hours. Urinalysis: No results for input(s): COLORURINE, LABSPEC, PHURINE, GLUCOSEU, HGBUR, BILIRUBINUR, KETONESUR, PROTEINUR, UROBILINOGEN, NITRITE, LEUKOCYTESUR in the last 72 hours.  Invalid input(s): APPERANCEUR Misc. Labs: None Micro Results: Recent Results (from the past 240 hour(s))  Blood culture (routine x 2)     Status: None (Preliminary result)   Collection Time: 07/24/15  6:22 AM  Result Value Ref Range Status   Specimen Description BLOOD RIGHT FOREARM  Final   Special Requests BOTTLES DRAWN AEROBIC AND ANAEROBIC 5ML  Final   Culture NO  GROWTH 2 DAYS  Final   Report Status PENDING  Incomplete  Blood culture (routine x 2)     Status: None (Preliminary result)   Collection Time: 07/24/15  6:29 AM  Result Value Ref Range Status   Specimen Description BLOOD RIGHT HAND  Final   Special Requests BOTTLES DRAWN AEROBIC AND ANAEROBIC 5ML  Final   Culture NO GROWTH 2 DAYS  Final   Report Status PENDING  Incomplete   Studies/Results: Dg Chest Port 1 View  07/27/2015  CLINICAL DATA:  Follow-up spontaneous left-sided pneumothorax treated with chest tube, URI  symptoms, former smoker. EXAM: PORTABLE CHEST 1 VIEW COMPARISON:  Portable chest x-ray of July 26, 2015 FINDINGS: The lungs remain hypoinflated. There is no pneumothorax evident today. The chest tube is been removed. Subsegmental atelectasis at the left lung base persists. No significant atelectasis on the right is observed today. The cardiac silhouette is largely obscured but remains enlarged. The pulmonary vascularity is normal. The mediastinum is normal in width. The observed bony thorax exhibits no acute abnormality. There is severe degenerative change of the left shoulder. IMPRESSION: Persistent bilateral hypo inflation. Basilar atelectasis on the left is stable. There is no pneumothorax. Electronically Signed   By: David  Martinique M.D.   On: 07/27/2015 07:41   Dg Chest Port 1 View  07/26/2015  CLINICAL DATA:  82 year old male with history of spontaneous pneumothorax on 07/24/2015 status post chest tube removal this morning. Followup study. EXAM: PORTABLE CHEST 1 VIEW COMPARISON:  Chest x-ray a 07/26/2015. FINDINGS: Previously noted left-sided chest tube has been removed. No appreciable left pneumothorax identified at this time. Lung volumes are very low and there are bibasilar opacities which are presumably areas of subsegmental atelectasis. No definite acute consolidative airspace disease. No pleural effusions. No evidence of pulmonary edema. Heart size appears normal. Upper mediastinal contours are within normal limits allowing for patient positioning. Atherosclerosis in the thoracic aorta. IMPRESSION: 1. Status post left chest tube removal.  No pneumothorax. 2. Low lung volumes with bibasilar areas of subsegmental atelectasis. 3. Atherosclerosis. Electronically Signed   By: Vinnie Langton M.D.   On: 07/26/2015 18:22   Dg Chest Port 1 View  07/26/2015  CLINICAL DATA:  Chest tube. EXAM: PORTABLE CHEST 1 VIEW COMPARISON:  07/25/2015. FINDINGS: Left chest tube in stable position. Mediastinum hilar  structures normal. Low lung volumes with basilar atelectasis. No pleural effusion or pneumothorax. IMPRESSION: 1.  Left chest tube in stable position.  No pneumothorax. 2. Low lung volumes with mild bibasilar subsegmental atelectasis. Electronically Signed   By: Marcello Moores  Register   On: 07/26/2015 07:31   Medications: I have reviewed the patient's current medications. Scheduled Meds: . fluticasone  2 spray Each Nare Daily  . sodium chloride flush  3 mL Intravenous Q12H   Continuous Infusions:  PRN Meds:.acetaminophen **OR** acetaminophen, dextromethorphan, HYDROcodone-acetaminophen, HYDROmorphone (DILAUDID) injection, polyethylene glycol Assessment/Plan: Active Problems:   Spontaneous pneumothorax   Chest tube in place   Pneumothorax, left   URI (upper respiratory infection)   Cough   Hypoxia   Pneumothorax   Encounter for chest tube placement  Lawrence Hicks is a 82 y.o. M with PMHx including chronic LBBB and htn and remote smoking history who presented with 2 week history of cough and congestion and acute onset of left sided back pain and was found to have spontaneous left sided pneumothorax.   Spontaneous Pneumothorax. Chest tube removed yesterday by pulm. CXR yesterday evening and this morning show resolution of pneumothorax. Lungs are well  inflated.  -Chest tube removed by pulm -Tylenol and hydrocodone PRN for pain  Cough/Congestion Most likely URI. Patient had influenza vaccine last fall. Influenza panel negative. -Continue Delsym  Hx of LBBB LBBB is stable from previous EKG.   Hx of HTN -continue home ramipril 2.5mg  daily  Dispo: D/c to home today with 2 week outpatient follow-up  This is a Careers information officer Note.  The care of the patient was discussed with Dr. Eppie Gibson and the assessment and plan formulated with their assistance.  Please see their attached note for official documentation of the daily encounter.   LOS: 3 days   Domingo Madeira, Med Student 07/27/2015, 10:24 AM

## 2015-07-27 NOTE — Progress Notes (Signed)
Pt in stable condition, wet over discharge instructions with pt, pt verbalised understanding, pt belongings at bedside, iv taken out, pt awaiting neighbour to pick him up, will continue to monitor.

## 2015-07-27 NOTE — Discharge Summary (Signed)
Name: Lawrence Hicks MRN: [id number] DOB: 09-20-1923 82 y.o. PCP: Lawrence Nims, MD  Date of Admission: 07/24/2015  5:03 AM Date of Discharge: 07/27/2015 Attending Physician: Oval Linsey, MD  Discharge Diagnosis:   Left Spontaneous Pneumothorax  Viral URI History of LBBB  Hypertension     Discharge Medications:   Medication List    TAKE these medications        acetaminophen 325 MG tablet  Commonly known as:  TYLENOL  Take 650 mg by mouth every 6 (six) hours as needed for mild pain.     dextromethorphan 30 MG/5ML liquid  Commonly known as:  DELSYM  Take 5 mLs (30 mg total) by mouth 2 (two) times daily as needed for cough.     ramipril 2.5 MG capsule  Commonly known as:  ALTACE  Take 1 capsule (2.5 mg total) by mouth daily.        Disposition and follow-up:   Lawrence Hicks was discharged from [facility] in Good condition.  At the hospital follow up visit please address:  1.  Spontaneous left sided pneumothorax that resolved with chest tube placement - please assess pain and ensure normal SpO2 on room air. Etiology thought be due to excessive coughing from URI. Can consider PFT's.         Viral URI - ensure resolution,was given delysm to take as needed for cough        Mild anemia - repeat CBC to ensure resolution    2.  Labs / imaging needed at time of follow-up: Blood cultures x 2 from 07/24/15, CBC  3.  Pending labs/ test needing follow-up: Consider PFT's to assess for underlying lung disease in setting of past tobacco use  Follow-up Appointments: Follow-up Information    Follow up with Lawrence Nims, MD On 08/10/2015.   Specialty:  Internal Medicine   Why:  2:15 PM   Contact information:   [place] [address] [phone]       Follow up with [facility] .   Why:  Home Health Physical Therapy   Contact information:   [phone]      Discharge Instructions:   Consultations:    Procedures  Performed:  Dg Chest 2 View  07/24/2015  CLINICAL DATA:  Acute onset of left upper back pain and shortness of breath. Initial encounter. EXAM: CHEST  2 VIEW COMPARISON:  CT of the chest performed 10/18/2010 FINDINGS: There is complete collapse of the left lung, with a left-sided pneumothorax involving the entire left hemithorax. No significant mediastinal shift is seen. Mild right basilar atelectasis is noted. No significant pleural effusion is identified. The cardiomediastinal silhouette is normal in size. No acute osseous abnormalities are seen. Degenerative change is noted at the left glenohumeral joint, with chronic remodeling humeral head. IMPRESSION: Complete collapse of the left lung, with a left-sided pneumothorax involving the entire left hemithorax. Mild right basilar atelectasis noted. Critical Value/emergent results were called by telephone at the time of interpretation on 07/24/2015 at 6:10 am to Dr. Lajean Saver, who verbally acknowledged these results. Electronically Signed   By: Garald Balding M.D.   On: 07/24/2015 06:12   Dg Chest Port 1 View  07/27/2015  CLINICAL DATA:  Follow-up spontaneous left-sided pneumothorax treated with chest tube, URI symptoms, former smoker. EXAM: PORTABLE CHEST 1 VIEW COMPARISON:  Portable chest x-ray of July 26, 2015 FINDINGS: The lungs remain hypoinflated. There is no pneumothorax evident today. The chest tube is been  removed. Subsegmental atelectasis at the left lung base persists. No significant atelectasis on the right is observed today. The cardiac silhouette is largely obscured but remains enlarged. The pulmonary vascularity is normal. The mediastinum is normal in width. The observed bony thorax exhibits no acute abnormality. There is severe degenerative change of the left shoulder. IMPRESSION: Persistent bilateral hypo inflation. Basilar atelectasis on the left is stable. There is no pneumothorax. Electronically Signed   By: David  Martinique M.D.   On: 07/27/2015  07:41   Dg Chest Port 1 View  07/26/2015  CLINICAL DATA:  82 year old male with history of spontaneous pneumothorax on 07/24/2015 status post chest tube removal this morning. Followup study. EXAM: PORTABLE CHEST 1 VIEW COMPARISON:  Chest x-ray a 07/26/2015. FINDINGS: Previously noted left-sided chest tube has been removed. No appreciable left pneumothorax identified at this time. Lung volumes are very low and there are bibasilar opacities which are presumably areas of subsegmental atelectasis. No definite acute consolidative airspace disease. No pleural effusions. No evidence of pulmonary edema. Heart size appears normal. Upper mediastinal contours are within normal limits allowing for patient positioning. Atherosclerosis in the thoracic aorta. IMPRESSION: 1. Status post left chest tube removal.  No pneumothorax. 2. Low lung volumes with bibasilar areas of subsegmental atelectasis. 3. Atherosclerosis. Electronically Signed   By: Vinnie Langton M.D.   On: 07/26/2015 18:22   Dg Chest Port 1 View  07/26/2015  CLINICAL DATA:  Chest tube. EXAM: PORTABLE CHEST 1 VIEW COMPARISON:  07/25/2015. FINDINGS: Left chest tube in stable position. Mediastinum hilar structures normal. Low lung volumes with basilar atelectasis. No pleural effusion or pneumothorax. IMPRESSION: 1.  Left chest tube in stable position.  No pneumothorax. 2. Low lung volumes with mild bibasilar subsegmental atelectasis. Electronically Signed   By: Marcello Moores  Register   On: 07/26/2015 07:31   Dg Chest Port 1 View  07/25/2015  CLINICAL DATA:  Left bundle branch block, followup left chest tube EXAM: PORTABLE CHEST 1 VIEW COMPARISON:  07/24/2015 FINDINGS: Limited inspiratory effect. Stable mild cardiac silhouette enlargement. Right lung clear except for minimal atelectasis at the base. On the left, stable chest tube. No pneumothorax. Stable mild left base opacities. No pneumothorax identified. IMPRESSION: Stable left chest tube and bibasilar opacities  Electronically Signed   By: Skipper Cliche M.D.   On: 07/25/2015 08:54   Dg Chest Portable 1 View  07/24/2015  CLINICAL DATA:  Two weeks of cough and congestion, new onset left-sided back pain and shortness of breath. Large left pneumothorax identified on chest x-ray from earlier today, status post chest tube placement. Nurse describes a large hematoma about the chest tube. EXAM: PORTABLE CHEST 1 VIEW COMPARISON:  Chest x-rays from earlier today. FINDINGS: Cardiomediastinal silhouette appears stable in size and configuration. Left-sided chest tube is stable in position with tip directed towards the left perihilar region. No pneumothorax seen. Probable mild bibasilar atelectasis. Lungs otherwise clear. No pleural effusion/hemothorax seen. Soft tissues about the chest are unremarkable, perhaps mild soft tissue prominence at the left-sided chest tube insertion site but not convincing. No osseous fracture or dislocation. Marked degenerative change again noted at the left shoulder. IMPRESSION: No change compared to the most recent prior chest x-ray from earlier today. Left-sided chest tube remains stable in position with tip directed towards the left hilum. No pneumothorax seen. No pleural effusion/hemothorax seen. Perhaps mild soft tissue prominence at the chest tube insertion site but not convincing. Electronically Signed   By: Franki Cabot M.D.   On: 07/24/2015 18:26  Dg Chest Port 1 View  07/24/2015  CLINICAL DATA:  Status post chest tube insertion EXAM: PORTABLE CHEST 1 VIEW COMPARISON:  July 24, 2015 FINDINGS: A left chest tube has been inserted in the interval. The distal tip projects over the thoracic aorta/mediastinum. The left pneumothorax is no longer present. The lucency beneath the right hemidiaphragm was noted to represent an interposed loop of colon on the lateral view of the film from earlier today. The cardiomediastinal silhouette is stable. No other interval changes. IMPRESSION: Interval  placement of left chest tube. See above for details. The left pneumothorax is no longer visualized. Electronically Signed   By: Dorise Bullion III M.D   On: 07/24/2015 07:31      Admission HPI: Original Limmie Patricia MD  Mr. Matt Engen is a 82 year old man with history of LBBB, HTN presenting with left upper back pain and dyspnea. He reports the pain woke up from sleep at 4AM this morning. He describes the pain similar to a sprain. It is constant. It radiated to the right side of his back. Tylenol helped relieve some of the pain. No exacerbating factors. He had associated labored breathing. Denies chest pain. No similar episode in the past. He has had an upper respiratory infection including rhinorrhea, sneezing, some productive cough with white to grey mucous for the past two weeks. Denies fevers, chills, hemoptysis, nausea, vomiting, diarrhea. Denies trauma. No sick contacts. No recent travel. Lives alone. 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 No history of exposures to chemicals.   Hospital Course by problem list:   Spontaneous Left Pneumothorax - Pt presented with new onset left sided upper back pain and dyspnea found to be hypoxic to SpO2 78% on RA on EMS arrival. He was found to have almost complete collapse of the entire left lung due to pneumothorax with no signs of infection. Chest tube was placed in the ED on 3/4 in the left thorax to 20cc suction and suction was changed to water seal on 3/5 that was subsequently discontinued on 3/6. Chest xray on day of discharge revealed persistent bilateral hypoinflation and left basilar atelectasis with no pneumothorax. Pt was seen by pulmonology during hospitalization who did not require further outpatient follow-up. His oxygen saturation was normal on room air on day of discharge. His pain was controlled with tylenol as needed. Pt with no prior history of pneumothorax and etiology thought to be due to excessive  coughing from recent viral URI. CT of the chest in 2012 revealed left lower lobe scarring but no evidence of bullous disease. Pt is a past tobacco smoker with no recorded PFT's, can consider obtaining as outpatient to assess lung function.   Viral URI - Pt presented with 2-week history of URI symptoms. Influenza PCR was negative. His URI symptoms improved with supportive care with delsym as needed for cough and flonase for nasal congestion. Pt was prescribed desylum on discharge as needed for cough.   History of LBBB -  EKG on admission revealed LBBB that was unchanged from previous EKG's. Pt was monitored on telemetry with no evidence of arrhythmia.   Hypertension - Pt with stable blood pressure during hospitalization on home ramipril 2.5 mg daily.     Discharge Vitals:   BP 118/69 mmHg  Pulse 84  Temp(Src) 98.3 F (36.8 C) (Oral)  Resp 18  Ht 5\' 7"  (1.702 m)  Wt 150 lb (68.04 kg)  BMI 23.49 kg/m2  SpO2 99%  Discharge Labs:  No results found  for this or any previous visit (from the past 24 hour(s)).  Signed: Juluis Mire, MD 07/27/2015, 1:23 PM    Services Ordered on Discharge: Home health PT & OT Equipment Ordered on Discharge: none

## 2015-07-27 NOTE — Discharge Instructions (Signed)
-Take delsym twice a day as needed for cough, this should be ready at your pharmacy  -Take tylenol as needed for pain  -Keep taking your blood pressure medication (ramipril)  -Please follow-up with our clinic (ground floor of hospital towards east wing) on March 21st at 2:15 PM with Dr. Genene Churn for follow-up -Pleasure taking care of you, hope you were satisfied with our care  Pneumothorax A pneumothorax, commonly called a collapsed lung, is a condition in which air leaks from a lung and builds up in the space between the lung and the chest wall (pleural space). The air in a pneumothorax is trapped outside the lung and takes up space, preventing the lung from fully expanding. This is a condition that usually occurs suddenly. The buildup of air may be small or large. A small pneumothorax may go away on its own. When a pneumothorax is larger, it will often require medical treatment and hospitalization.  CAUSES  A pneumothorax can sometimes happen quickly with no apparent cause. People with underlying lung problems, particularly COPD or emphysema, are at higher risk of pneumothorax. However, pneumothorax can happen quickly even in people with no prior known lung problems. Trauma, surgery, medical procedures, or injury to the chest wall can also cause a pneumothorax. SIGNS AND SYMPTOMS  Sometimes a pneumothorax will have no symptoms. When symptoms are present, they can include:  Chest pain.  Shortness of breath.  Increased rate of breathing.  Bluish color to your lips or skin (cyanosis). DIAGNOSIS  Pneumothorax is usually diagnosed by a chest X-ray or chest CT scan. Your health care provider will also take a medical history and perform a physical exam to determine why you may have a pneumothorax. TREATMENT  A small pneumothorax may go away on its own without treatment. Extra oxygen can sometimes help a small pneumothorax go away more quickly. For a larger pneumothorax or a pneumothorax that is  causing symptoms, a procedure is usually needed to drain the air.In some cases, the health care provider may drain the air using a needle. In other cases, a chest tube may be inserted into the pleural space. A chest tube is a small tube placed between the ribs and into the pleural space. This removes the extra air and allows the lung to expand back to its normal size. A large pneumothorax will usually require a hospital stay. If there is ongoing air leakage into the pleural space, then the chest tube may need to remain in place for several days until the air leak has healed. In some cases, surgery may be needed.  HOME CARE INSTRUCTIONS   Only take over-the-counter or prescription medicines as directed by your health care provider.  If a cough or pain makes it difficult for you to sleep at night, try sleeping in a semi-upright position in a recliner or by using 2 or 3 pillows.  Rest and limit activity as directed by your health care provider.  If you had a chest tube and it was removed, ask your health care provider when it is okay to remove the dressing. Until your health care provider says you can remove the dressing, do not allow it to get wet.  Do not smoke. Smoking is a risk factor for pneumothorax.  Do not fly in an airplane or scuba dive until your health care provider says it is okay.  Follow up with your health care provider as directed. SEEK IMMEDIATE MEDICAL CARE IF:   You have increasing chest pain or shortness  of breath.  You have a cough that is not controlled with suppressants.  You begin coughing up blood.  You have pain that is getting worse or is not controlled with medicines.  You cough up thick, discolored mucus (sputum) that is yellow to green in color.  You have redness, increasing pain, or discharge at the site where a chest tube had been in place (if your pneumothorax was treated with a chest tube).  The site where your chest tube was located opens up.  You  feel air coming out of the site where the chest tube was placed.  You have a fever or persistent symptoms for more than 2-3 days.  You have a fever and your symptoms suddenly get worse. MAKE SURE YOU:   Understand these instructions.  Will watch your condition.  Will get help right away if you are not doing well or get worse.   This information is not intended to replace advice given to you by your health care provider. Make sure you discuss any questions you have with your health care provider.   Document Released: 05/08/2005 Document Revised: 02/26/2013 Document Reviewed: 12/05/2012 Elsevier Interactive Patient Education [employer].

## 2015-07-27 NOTE — Care Management Note (Addendum)
Case Management Note  Patient Details  Name: Lawrence Hicks MRN: [id number] Date of Birth: Jan 25, 1924  Subjective/Objective:     Spontaneous Pneumothorax               Action/Plan: NCM spoke to pt and gave permission to speak to [facility], # [phone]. Offered choice for HH/provided list. Pt agreeable to [facility] for [facility] PT. States he has walker and cane at home. He lives alone. His neighbor, Burman Nieves assist him with getting his mail and taking out his trash. He calls a taxi to go to his appts. Pt states she did not want Medical Alert information. Contacted [place] with new referral. Pt has follow up appt for Dr Dellia Nims at [facility] on 08/10/2015 at 2:15 pm.  Received call from [facility], and states she will pick up pt today at 6:30 pm.  Expected Discharge Date:  07/27/15               Expected Discharge Plan:  [place]  In-House Referral:  NA  Discharge planning Services  CM Consult  Post Acute Care Choice:  Home Health Choice offered to:  Patient  DME Arranged:  N/A DME Agency:  NA  HH Arranged:  PT HH Agency:  [facility]  Status of Service:  Completed, signed off  Medicare Important Message Given:  Yes Date Medicare IM Given:    Medicare IM give by:    Date Additional Medicare IM Given:    Additional Medicare Important Message give by:     If discussed at [place] of Stay Meetings, dates discussed:    Additional Comments:  Erenest Rasher, RN 07/27/2015, 12:33 PM

## 2015-07-27 NOTE — Care Management Important Message (Signed)
Important Message  Patient Details  Name: Lawrence Hicks MRN: [id number] Date of Birth: 02-27-1924   Medicare Important Message Given:  Yes    Loann Quill 07/27/2015, 8:28 AM

## 2015-07-27 NOTE — Progress Notes (Signed)
Occupational Therapy Treatment Patient Details Name: Mendy Klapper MRN: [id number] DOB: January 18, 1924 Today's Date: 07/27/2015    History of present illness 82 yo admitted with left spontaneous PTX. PMhx: LBBB, HTN   OT comments  Pt immediately willing to work with OT. Pt without awareness of being in a urine soaked bed. He was surprised how weak he felt during ambulation. Pt now agreeable to home health, case manager notified. Pt agreed to use his RW when he gets home.   Follow Up Recommendations  Home health OT (HHPT)    Equipment Recommendations  None recommended by OT    Recommendations for Other Services      Precautions / Restrictions Precautions Precautions: Fall       Mobility Bed Mobility Overal bed mobility: Modified Independent                Transfers Overall transfer level: Needs assistance Equipment used: Rolling walker (2 wheeled) Transfers: Sit to/from Stand Sit to Stand: Supervision         General transfer comment: for safety    Balance                                   ADL Overall ADL's : Needs assistance/impaired     Grooming: Wash/dry hands;Brushing hair;Standing;Supervision/safety       Lower Body Bathing: Supervison/ safety;Sit to/from stand Lower Body Bathing Details (indicate cue type and reason): Pt with urinary incontinence and unaware. Upper Body Dressing : Set up;Sitting   Lower Body Dressing: Supervision/safety;Sit to/from stand   Toilet Transfer: Supervision/safety;Ambulation;Regular Toilet;RW   Toileting- Clothing Manipulation and Hygiene: Supervision/safety;Sit to/from stand       Functional mobility during ADLs: Min guard;Rolling walker General ADL Comments: pt ambulated to nurses station with walker, agreed he needed to use his walker at home not his hurry cane      Vision                     Perception     Praxis      Cognition   Behavior During Therapy: [facility] for tasks  assessed/performed Overall Cognitive Status: Impaired/Different from baseline Area of Impairment: Memory;Safety/judgement     Memory: Decreased short-term memory    Safety/Judgement: Decreased awareness of deficits     General Comments: pt surprise at how weak he was even after having not been OOB in 2 days    Extremity/Trunk Assessment               Exercises     Shoulder Instructions       General Comments      Pertinent Vitals/ Pain       Pain Assessment: No/denies pain  Home Living                                          Prior Functioning/Environment              Frequency Min 2X/week     Progress Toward Goals  OT Goals(current goals can now be found in the care plan section)  Progress towards OT goals: Progressing toward goals     Plan Discharge plan needs to be updated    Co-evaluation                 End of Session  Equipment Utilized During Treatment: Gait belt;Rolling walker   Activity Tolerance Patient tolerated treatment well   Patient Left in chair;with call bell/phone within reach;with chair alarm set   Nurse Communication  (pt agreeable to [facility] now)        Time: [id number] OT Time Calculation (min): 37 min  Charges: OT General Charges $OT Visit: 1 Procedure OT Treatments $Self Care/Home Management : 23-37 mins  Malka So 07/27/2015, 12:50 PM  [phone]

## 2015-07-27 NOTE — Progress Notes (Signed)
S: Lawrence Hicks is well from a breathing standpoint and denies SOB or chest pain.  He is generally weak and has agreed to home health physical therapy.  Physical Exam:  Filed Vitals:   07/26/15 2113 07/27/15 0429 07/27/15 1107 07/27/15 1500  BP: 115/48 105/50 118/69 135/69  Pulse: 79 83 84 85  Temp: 97.6 F (36.4 C) 99.2 F (37.3 C) 98.3 F (36.8 C) 97.6 F (36.4 C)  TempSrc: Oral Oral Oral Oral  Resp: 18 18 18 18   Height:      Weight:      SpO2: 93% 96% 99% 97%   Gen.: Well-developed, well-nourished, man lying comfortably in bed in no acute distress. Lungs: Good breath sounds throughout, clear to auscultation bilaterally without wheezes, rhonchi, or rales. Heart: Regular rate and rhythm without murmurs, rubs, or gallops. Abdomen: Soft, nontender, active bowel sounds. Extremities: Without edema.  Chest x-ray 07/27/2015: Mild blunting of the left costophrenic angle consistent with a mild effusion. No recurrent or residual pneumothorax after removal of the chest tube.  Assessment  Lawrence Hicks is a 82 year old man presented with a spontaneous pneumothorax that is felt to be primary in nature and not requiring pleurodesis at this time. He responded well to tube thoracostomy with suction and then water seal and removal without recurrence of his pneumothorax. He is stable for discharge home.  1) Left spontaneous pneumothorax: Resolved with tube thoracostomy and suction. He has not had a recurrence after discontinuance of the left-sided chest tube.  2) Disposition: He is safe for discharge home today with follow-up in his primary care provider's office.

## 2015-07-27 NOTE — Telephone Encounter (Addendum)
Pt needs TOC Pt scheduled for 08/10/15 at 2:15 with dr Genene Churn

## 2015-07-29 LAB — CULTURE, BLOOD (ROUTINE X 2)
CULTURE: NO GROWTH
Culture: NO GROWTH

## 2015-07-29 NOTE — Telephone Encounter (Signed)
No answer, lm for rtc 

## 2015-07-30 NOTE — Telephone Encounter (Signed)
No answer

## 2015-08-02 ENCOUNTER — Other Ambulatory Visit: Payer: Self-pay | Admitting: Internal Medicine

## 2015-08-03 ENCOUNTER — Telehealth: Payer: Self-pay | Admitting: Internal Medicine

## 2015-08-03 ENCOUNTER — Ambulatory Visit: Payer: Medicare Other | Admitting: Internal Medicine

## 2015-08-03 ENCOUNTER — Other Ambulatory Visit: Payer: Self-pay | Admitting: Internal Medicine

## 2015-08-03 NOTE — Telephone Encounter (Signed)
Pt neighbor is calling concerned about pt states that pt refused pt on Friday because he wasn't feeling well and was told that thye would be back on Monday 3/13 with a nurse at 1:00. The nurse canceled and did not reschedule. She is concerned because he needs to be seen but is unable to leave. She doesn't know when someone is coming back to the house to check on him . She states he is getting congested again. Pt neighbor states if she doesn't answer to please leave a message so she can call back.

## 2015-08-03 NOTE — Telephone Encounter (Signed)
CSW placed called to pt.  CSW left message requesting return call. CSW provided contact hours and phone number.  Call placed to [facility] to obtain agency's last contact with Lawrence Hicks.  CSW was transferred to scheduling and message left.  Once contact is made with patient, will offer referral to [facility].

## 2015-08-03 NOTE — Telephone Encounter (Signed)
Spoke with neighbor Maggie after unsuccessful attempt at patient contact.  Maggie reporting pt is becoming congested again, still has bandage on left side from chest tube and has been unable to shower.  She reports patient is eating okay but is feeling discouraged that he still feels so bad and no one has checked on him (HH with [facility] hasn't been a success to date).    Offered earlier appointment and Burman Nieves states patient has no way to get to clinic, asked about resources for transportation to clinic.  Will ask for help from clinic social worker

## 2015-08-03 NOTE — Telephone Encounter (Signed)
Tried to call patient.  VM was interrupted.

## 2015-08-04 ENCOUNTER — Telehealth: Payer: Self-pay | Admitting: Licensed Clinical Social Worker

## 2015-08-04 ENCOUNTER — Telehealth: Payer: Self-pay | Admitting: *Deleted

## 2015-08-04 DIAGNOSIS — Z87891 Personal history of nicotine dependence: Secondary | ICD-10-CM | POA: Diagnosis not present

## 2015-08-04 DIAGNOSIS — Z7951 Long term (current) use of inhaled steroids: Secondary | ICD-10-CM | POA: Diagnosis not present

## 2015-08-04 DIAGNOSIS — M549 Dorsalgia, unspecified: Secondary | ICD-10-CM | POA: Diagnosis not present

## 2015-08-04 DIAGNOSIS — I447 Left bundle-branch block, unspecified: Secondary | ICD-10-CM | POA: Diagnosis not present

## 2015-08-04 DIAGNOSIS — I10 Essential (primary) hypertension: Secondary | ICD-10-CM | POA: Diagnosis not present

## 2015-08-04 DIAGNOSIS — M6281 Muscle weakness (generalized): Secondary | ICD-10-CM | POA: Diagnosis not present

## 2015-08-04 DIAGNOSIS — Z4800 Encounter for change or removal of nonsurgical wound dressing: Secondary | ICD-10-CM | POA: Diagnosis not present

## 2015-08-04 NOTE — Telephone Encounter (Signed)
Calls for verbal orders for: 1) HHN for treat and eval 2) csw for eval of needs for home and community resources 3) PT and OT 2 times weekly This gentleman is living alone a neighbor does help as much as she can but this is not an ideal situation, will await assess by csw and HHN, will then possibly move appt up Sent to dr Genene Churn and attending for VO approval

## 2015-08-04 NOTE — Telephone Encounter (Signed)
Lawrence Hicks was referred to CSW as pt's neighbor voiced concern regarding pt's current health status and home health services.  CSW placed call to [facility] to obtain pt's last date of service and received voicemail back from [name].  CSW returned call and left another voice mail.  CSW returned call to Lawrence Hicks, message left.

## 2015-08-04 NOTE — Telephone Encounter (Signed)
Please see CSW telephone note 08/04/15 for additional information on this patient.

## 2015-08-04 NOTE — Telephone Encounter (Signed)
I approve

## 2015-08-05 ENCOUNTER — Encounter: Payer: Self-pay | Admitting: Licensed Clinical Social Worker

## 2015-08-05 DIAGNOSIS — M549 Dorsalgia, unspecified: Secondary | ICD-10-CM | POA: Diagnosis not present

## 2015-08-05 DIAGNOSIS — M6281 Muscle weakness (generalized): Secondary | ICD-10-CM | POA: Diagnosis not present

## 2015-08-05 DIAGNOSIS — I10 Essential (primary) hypertension: Secondary | ICD-10-CM | POA: Diagnosis not present

## 2015-08-05 DIAGNOSIS — I447 Left bundle-branch block, unspecified: Secondary | ICD-10-CM | POA: Diagnosis not present

## 2015-08-05 DIAGNOSIS — Z4800 Encounter for change or removal of nonsurgical wound dressing: Secondary | ICD-10-CM | POA: Diagnosis not present

## 2015-08-05 DIAGNOSIS — Z7951 Long term (current) use of inhaled steroids: Secondary | ICD-10-CM | POA: Diagnosis not present

## 2015-08-05 NOTE — Telephone Encounter (Signed)
CSW placed called to pt.  CSW left message requesting return call. CSW provided contact hours and phone number. Neighbor returned call to CSW to obtain additional information regarding [employer], [employer] and [employer].  CSW informed neighbor that she is currently not listed on Mr. Kubas EMR as an emergency contact as such CSW can not divulge pt's PHI.  Neighbor states [place] agency has inquired if she would be able to assist with Mr. Loveless dressing changes, neighbor feeling as though she is getting in too far regarding pt's care needs.  Neighbor states she can not provide daily health care in addition to transport to/from doctor appointments and meal prep.  Neighbor concerned APS would remove pt from the home and that is not what her desire is at this time;  neighbor feels pt is in need of an office visit and short term placement until he is able to manage is health independently.  CSW reassured Maggie, APS' goal is for pt to be safe in the home and to assist with linking patient to needed services.  Neighbor informed report to APS can be made anonymously.  Neighbor notified our office can only attempt to contact patient by telephone, difficulty to provide information and services if calls are not returned or if patients do not answer the telephone.  Neighbor thanked CSW for information and states she will contact APS.   CSW will send letter to Mr. Benoit regarding [facility] service, [employer].

## 2015-08-05 NOTE — Telephone Encounter (Signed)
CSW received call from pt's neighbor, Lawrence Hicks, requesting return call.  Neighbor obtained [place] phone number for pt's voicemail.  CSW returned call to neighbor.  Neighbor states she thinks Lawrence Hicks is confused and did not answer the door this morning when home health went to the home. Neighbor states she works full time and can not confirm, but this morning saw [facility] agency at patient's door.  CSW provided neighbor with the phone number to Adult Protective Services for neighbor to call with her concerns.  CSW encouraged neighbor to call APS with her concerns regarding Lawrence Hicks, as APS is able to go to pt's home and assess.

## 2015-08-06 ENCOUNTER — Other Ambulatory Visit: Payer: Self-pay | Admitting: Internal Medicine

## 2015-08-07 ENCOUNTER — Other Ambulatory Visit: Payer: Self-pay | Admitting: Internal Medicine

## 2015-08-10 ENCOUNTER — Telehealth: Payer: Self-pay | Admitting: *Deleted

## 2015-08-10 ENCOUNTER — Encounter: Payer: Self-pay | Admitting: Internal Medicine

## 2015-08-10 ENCOUNTER — Ambulatory Visit (INDEPENDENT_AMBULATORY_CARE_PROVIDER_SITE_OTHER): Payer: Medicare Other | Admitting: Internal Medicine

## 2015-08-10 VITALS — BP 156/71 | HR 86 | Temp 97.9°F | Resp 20 | Ht 62.0 in | Wt 136.9 lb

## 2015-08-10 DIAGNOSIS — Z8709 Personal history of other diseases of the respiratory system: Secondary | ICD-10-CM | POA: Diagnosis not present

## 2015-08-10 DIAGNOSIS — J9383 Other pneumothorax: Secondary | ICD-10-CM

## 2015-08-10 DIAGNOSIS — B9689 Other specified bacterial agents as the cause of diseases classified elsewhere: Secondary | ICD-10-CM

## 2015-08-10 DIAGNOSIS — L03313 Cellulitis of chest wall: Secondary | ICD-10-CM | POA: Diagnosis not present

## 2015-08-10 DIAGNOSIS — I10 Essential (primary) hypertension: Secondary | ICD-10-CM | POA: Diagnosis not present

## 2015-08-10 DIAGNOSIS — L0390 Cellulitis, unspecified: Secondary | ICD-10-CM | POA: Insufficient documentation

## 2015-08-10 NOTE — Progress Notes (Signed)
   Subjective:    Patient ID: Lawrence Hicks, male    DOB: 04-Jun-1923, 82 y.o.   MRN: [id number]  HPI  82 yo male with HTN, LBB, recent admission on 07/24/15 to 07/27/15 for URI and spontaneous PTX here for hospital follow up.  He presented initially with URI symptoms, was found to have spontaneous PTX on Left lung causing complete collapse of left lung. Had chest tube placed with resolution of PTX.   He is doing well. No SOB, coughing is getting better. Still has some nasal congestion. Using Flonase daily. The home RN was concerned about previous chest tube insertion site infection. It has some redness and tenderness, with some very small amount of clear discharge. No fevers.   Review of Systems  Constitutional: Negative for fever, chills and fatigue.  HENT: Negative for congestion and sore throat.   Eyes: Negative for photophobia.  Respiratory: Negative for cough, chest tightness and shortness of breath.   Cardiovascular: Negative for chest pain, palpitations and leg swelling.  Gastrointestinal: Negative for abdominal pain and abdominal distention.  Endocrine: Negative.   Genitourinary: Negative.   Musculoskeletal: Negative for back pain and arthralgias.  Skin:       Left chest wall Chest tube incision site infection  Neurological: Negative for dizziness, seizures, numbness and headaches.  Hematological: Negative.        Objective:   Physical Exam  Constitutional: He is oriented to person, place, and time. He appears well-developed and well-nourished.  HENT:  Head: Normocephalic and atraumatic.  Eyes: Conjunctivae are normal. Pupils are equal, round, and reactive to light.  Neck: Normal range of motion. No JVD present.  Cardiovascular: Normal rate and regular rhythm.  Exam reveals no gallop and no friction rub.   No murmur heard. Pulmonary/Chest: Effort normal and breath sounds normal. No respiratory distress. He has no wheezes.  Left lateral chest area has suture from previous  chest tube with some surrounding erythema, tenderness, and very small amount of clear/yellow drainage. No foul smell, no induration or fluctuance.   Abdominal: Soft. Bowel sounds are normal. He exhibits no distension. There is no tenderness.  Musculoskeletal: Normal range of motion.  Cautious gait.  Neurological: He is alert and oriented to person, place, and time. No cranial nerve deficit.  Psychiatric: He has a normal mood and affect.   Filed Vitals:   08/10/15 1432  BP: 156/71  Pulse: 86  Temp: 97.9 F (36.6 C)  Resp: 20        Assessment & Plan:  See problem based a&p.

## 2015-08-10 NOTE — Telephone Encounter (Signed)
HHN wants VO for: 2xweek for 7 weeks 2 prn visits Monitor chest tube incision site, clean w/ NS, dry with gauze and place dry drsg of gauze over site VO given, do you approve?

## 2015-08-10 NOTE — Assessment & Plan Note (Signed)
Filed Vitals:   08/10/15 1432  BP: 156/71  Pulse: 86  Temp: 97.9 F (36.6 C)  Resp: 20   BP higher than goal. currently on ramipril 2.5mg  daily. Will go up to 5mg  daily

## 2015-08-10 NOTE — Telephone Encounter (Signed)
Approve

## 2015-08-10 NOTE — Assessment & Plan Note (Addendum)
Resolution of pneumothorax was determined after d/cing CT on f/up CXR. Clinically it also seems to be resolved now. Breathing fine, satting high 90's in room air. No SOB. Lung exam is equal on both sides.   Still having some cough from his URI episode but overall he is better. Still using the cough medication.

## 2015-08-10 NOTE — Patient Instructions (Signed)
Please take the antibiotic twice a day for 5 days.  Let us know if your infection is not clearing up or you have having fevers.  Keep using the cough medication for your cough.  Follow up in 3 months.

## 2015-08-10 NOTE — Assessment & Plan Note (Signed)
Chest tube site on left chest wall has some erythema, tenderness, and small clear drainage coming from the incision site. He is afebrile. No induration. I feel this is mild cellulitis on the incision site.  Removed the sutures. - Will treat with doxy 100mg  BID for 5 days.

## 2015-08-11 DIAGNOSIS — M6281 Muscle weakness (generalized): Secondary | ICD-10-CM | POA: Diagnosis not present

## 2015-08-11 DIAGNOSIS — Z4800 Encounter for change or removal of nonsurgical wound dressing: Secondary | ICD-10-CM | POA: Diagnosis not present

## 2015-08-11 DIAGNOSIS — Z7951 Long term (current) use of inhaled steroids: Secondary | ICD-10-CM | POA: Diagnosis not present

## 2015-08-11 DIAGNOSIS — M549 Dorsalgia, unspecified: Secondary | ICD-10-CM | POA: Diagnosis not present

## 2015-08-11 DIAGNOSIS — I447 Left bundle-branch block, unspecified: Secondary | ICD-10-CM | POA: Diagnosis not present

## 2015-08-11 DIAGNOSIS — I10 Essential (primary) hypertension: Secondary | ICD-10-CM | POA: Diagnosis not present

## 2015-08-11 MED ORDER — DOXYCYCLINE HYCLATE 100 MG PO TABS: 100.0000 mg | ORAL_TABLET | Freq: Two times a day (BID) | ORAL | Status: DC

## 2015-08-12 DIAGNOSIS — Z7951 Long term (current) use of inhaled steroids: Secondary | ICD-10-CM | POA: Diagnosis not present

## 2015-08-12 DIAGNOSIS — Z4800 Encounter for change or removal of nonsurgical wound dressing: Secondary | ICD-10-CM | POA: Diagnosis not present

## 2015-08-12 DIAGNOSIS — I10 Essential (primary) hypertension: Secondary | ICD-10-CM | POA: Diagnosis not present

## 2015-08-12 DIAGNOSIS — M6281 Muscle weakness (generalized): Secondary | ICD-10-CM | POA: Diagnosis not present

## 2015-08-12 DIAGNOSIS — I447 Left bundle-branch block, unspecified: Secondary | ICD-10-CM | POA: Diagnosis not present

## 2015-08-12 DIAGNOSIS — M549 Dorsalgia, unspecified: Secondary | ICD-10-CM | POA: Diagnosis not present

## 2015-08-12 NOTE — Progress Notes (Signed)
Internal Medicine Clinic Attending  Case discussed with Dr. Ahmed at the time of the visit.  We reviewed the resident's history and exam and pertinent patient test results.  I agree with the assessment, diagnosis, and plan of care documented in the resident's note. 

## 2015-08-13 DIAGNOSIS — M549 Dorsalgia, unspecified: Secondary | ICD-10-CM | POA: Diagnosis not present

## 2015-08-13 DIAGNOSIS — I10 Essential (primary) hypertension: Secondary | ICD-10-CM | POA: Diagnosis not present

## 2015-08-13 DIAGNOSIS — M6281 Muscle weakness (generalized): Secondary | ICD-10-CM | POA: Diagnosis not present

## 2015-08-13 DIAGNOSIS — I447 Left bundle-branch block, unspecified: Secondary | ICD-10-CM | POA: Diagnosis not present

## 2015-08-13 DIAGNOSIS — Z4800 Encounter for change or removal of nonsurgical wound dressing: Secondary | ICD-10-CM | POA: Diagnosis not present

## 2015-08-13 DIAGNOSIS — Z7951 Long term (current) use of inhaled steroids: Secondary | ICD-10-CM | POA: Diagnosis not present

## 2015-08-16 DIAGNOSIS — I447 Left bundle-branch block, unspecified: Secondary | ICD-10-CM | POA: Diagnosis not present

## 2015-08-16 DIAGNOSIS — Z4800 Encounter for change or removal of nonsurgical wound dressing: Secondary | ICD-10-CM | POA: Diagnosis not present

## 2015-08-16 DIAGNOSIS — I10 Essential (primary) hypertension: Secondary | ICD-10-CM | POA: Diagnosis not present

## 2015-08-16 DIAGNOSIS — M549 Dorsalgia, unspecified: Secondary | ICD-10-CM | POA: Diagnosis not present

## 2015-08-16 DIAGNOSIS — M6281 Muscle weakness (generalized): Secondary | ICD-10-CM | POA: Diagnosis not present

## 2015-08-16 DIAGNOSIS — Z7951 Long term (current) use of inhaled steroids: Secondary | ICD-10-CM | POA: Diagnosis not present

## 2015-08-17 DIAGNOSIS — I447 Left bundle-branch block, unspecified: Secondary | ICD-10-CM | POA: Diagnosis not present

## 2015-08-17 DIAGNOSIS — Z4800 Encounter for change or removal of nonsurgical wound dressing: Secondary | ICD-10-CM | POA: Diagnosis not present

## 2015-08-17 DIAGNOSIS — Z7951 Long term (current) use of inhaled steroids: Secondary | ICD-10-CM | POA: Diagnosis not present

## 2015-08-17 DIAGNOSIS — M549 Dorsalgia, unspecified: Secondary | ICD-10-CM | POA: Diagnosis not present

## 2015-08-17 DIAGNOSIS — M6281 Muscle weakness (generalized): Secondary | ICD-10-CM | POA: Diagnosis not present

## 2015-08-17 DIAGNOSIS — I10 Essential (primary) hypertension: Secondary | ICD-10-CM | POA: Diagnosis not present

## 2015-08-18 DIAGNOSIS — Z7951 Long term (current) use of inhaled steroids: Secondary | ICD-10-CM | POA: Diagnosis not present

## 2015-08-18 DIAGNOSIS — Z4800 Encounter for change or removal of nonsurgical wound dressing: Secondary | ICD-10-CM | POA: Diagnosis not present

## 2015-08-18 DIAGNOSIS — I10 Essential (primary) hypertension: Secondary | ICD-10-CM | POA: Diagnosis not present

## 2015-08-18 DIAGNOSIS — M6281 Muscle weakness (generalized): Secondary | ICD-10-CM | POA: Diagnosis not present

## 2015-08-18 DIAGNOSIS — I447 Left bundle-branch block, unspecified: Secondary | ICD-10-CM | POA: Diagnosis not present

## 2015-08-18 DIAGNOSIS — M549 Dorsalgia, unspecified: Secondary | ICD-10-CM | POA: Diagnosis not present

## 2015-08-19 DIAGNOSIS — I447 Left bundle-branch block, unspecified: Secondary | ICD-10-CM | POA: Diagnosis not present

## 2015-08-19 DIAGNOSIS — I10 Essential (primary) hypertension: Secondary | ICD-10-CM | POA: Diagnosis not present

## 2015-08-19 DIAGNOSIS — M549 Dorsalgia, unspecified: Secondary | ICD-10-CM | POA: Diagnosis not present

## 2015-08-19 DIAGNOSIS — Z7951 Long term (current) use of inhaled steroids: Secondary | ICD-10-CM | POA: Diagnosis not present

## 2015-08-19 DIAGNOSIS — M6281 Muscle weakness (generalized): Secondary | ICD-10-CM | POA: Diagnosis not present

## 2015-08-19 DIAGNOSIS — Z4800 Encounter for change or removal of nonsurgical wound dressing: Secondary | ICD-10-CM | POA: Diagnosis not present

## 2015-08-20 ENCOUNTER — Telehealth: Payer: Self-pay | Admitting: *Deleted

## 2015-08-20 DIAGNOSIS — Z4800 Encounter for change or removal of nonsurgical wound dressing: Secondary | ICD-10-CM | POA: Diagnosis not present

## 2015-08-20 DIAGNOSIS — Z7951 Long term (current) use of inhaled steroids: Secondary | ICD-10-CM | POA: Diagnosis not present

## 2015-08-20 DIAGNOSIS — M6281 Muscle weakness (generalized): Secondary | ICD-10-CM | POA: Diagnosis not present

## 2015-08-20 DIAGNOSIS — M549 Dorsalgia, unspecified: Secondary | ICD-10-CM | POA: Diagnosis not present

## 2015-08-20 DIAGNOSIS — I447 Left bundle-branch block, unspecified: Secondary | ICD-10-CM | POA: Diagnosis not present

## 2015-08-20 DIAGNOSIS — I10 Essential (primary) hypertension: Secondary | ICD-10-CM | POA: Diagnosis not present

## 2015-08-20 NOTE — Telephone Encounter (Signed)
Call from [facility] - stated pt is progressing needs verbal order to decrease Nursing visits to 1 time a week for 6 weeks. Verbal order given - if not appropriate, let me know.

## 2015-08-23 DIAGNOSIS — I10 Essential (primary) hypertension: Secondary | ICD-10-CM | POA: Diagnosis not present

## 2015-08-23 DIAGNOSIS — I447 Left bundle-branch block, unspecified: Secondary | ICD-10-CM | POA: Diagnosis not present

## 2015-08-23 DIAGNOSIS — M6281 Muscle weakness (generalized): Secondary | ICD-10-CM | POA: Diagnosis not present

## 2015-08-23 DIAGNOSIS — M549 Dorsalgia, unspecified: Secondary | ICD-10-CM | POA: Diagnosis not present

## 2015-08-23 DIAGNOSIS — Z4800 Encounter for change or removal of nonsurgical wound dressing: Secondary | ICD-10-CM | POA: Diagnosis not present

## 2015-08-23 DIAGNOSIS — Z7951 Long term (current) use of inhaled steroids: Secondary | ICD-10-CM | POA: Diagnosis not present

## 2015-08-25 DIAGNOSIS — Z7951 Long term (current) use of inhaled steroids: Secondary | ICD-10-CM | POA: Diagnosis not present

## 2015-08-25 DIAGNOSIS — I10 Essential (primary) hypertension: Secondary | ICD-10-CM | POA: Diagnosis not present

## 2015-08-25 DIAGNOSIS — I447 Left bundle-branch block, unspecified: Secondary | ICD-10-CM | POA: Diagnosis not present

## 2015-08-25 DIAGNOSIS — Z4800 Encounter for change or removal of nonsurgical wound dressing: Secondary | ICD-10-CM | POA: Diagnosis not present

## 2015-08-25 DIAGNOSIS — M6281 Muscle weakness (generalized): Secondary | ICD-10-CM | POA: Diagnosis not present

## 2015-08-25 DIAGNOSIS — M549 Dorsalgia, unspecified: Secondary | ICD-10-CM | POA: Diagnosis not present

## 2015-08-26 DIAGNOSIS — I10 Essential (primary) hypertension: Secondary | ICD-10-CM | POA: Diagnosis not present

## 2015-08-26 DIAGNOSIS — M6281 Muscle weakness (generalized): Secondary | ICD-10-CM | POA: Diagnosis not present

## 2015-08-26 DIAGNOSIS — Z7951 Long term (current) use of inhaled steroids: Secondary | ICD-10-CM | POA: Diagnosis not present

## 2015-08-26 DIAGNOSIS — M549 Dorsalgia, unspecified: Secondary | ICD-10-CM | POA: Diagnosis not present

## 2015-08-26 DIAGNOSIS — I447 Left bundle-branch block, unspecified: Secondary | ICD-10-CM | POA: Diagnosis not present

## 2015-08-26 DIAGNOSIS — Z4800 Encounter for change or removal of nonsurgical wound dressing: Secondary | ICD-10-CM | POA: Diagnosis not present

## 2015-08-27 DIAGNOSIS — Z7951 Long term (current) use of inhaled steroids: Secondary | ICD-10-CM | POA: Diagnosis not present

## 2015-08-27 DIAGNOSIS — M6281 Muscle weakness (generalized): Secondary | ICD-10-CM | POA: Diagnosis not present

## 2015-08-27 DIAGNOSIS — I447 Left bundle-branch block, unspecified: Secondary | ICD-10-CM | POA: Diagnosis not present

## 2015-08-27 DIAGNOSIS — I10 Essential (primary) hypertension: Secondary | ICD-10-CM | POA: Diagnosis not present

## 2015-08-27 DIAGNOSIS — M549 Dorsalgia, unspecified: Secondary | ICD-10-CM | POA: Diagnosis not present

## 2015-08-27 DIAGNOSIS — Z4800 Encounter for change or removal of nonsurgical wound dressing: Secondary | ICD-10-CM | POA: Diagnosis not present

## 2015-08-30 DIAGNOSIS — I10 Essential (primary) hypertension: Secondary | ICD-10-CM | POA: Diagnosis not present

## 2015-08-30 DIAGNOSIS — Z7951 Long term (current) use of inhaled steroids: Secondary | ICD-10-CM | POA: Diagnosis not present

## 2015-08-30 DIAGNOSIS — M549 Dorsalgia, unspecified: Secondary | ICD-10-CM | POA: Diagnosis not present

## 2015-08-30 DIAGNOSIS — Z4800 Encounter for change or removal of nonsurgical wound dressing: Secondary | ICD-10-CM | POA: Diagnosis not present

## 2015-08-30 DIAGNOSIS — M6281 Muscle weakness (generalized): Secondary | ICD-10-CM | POA: Diagnosis not present

## 2015-08-30 DIAGNOSIS — I447 Left bundle-branch block, unspecified: Secondary | ICD-10-CM | POA: Diagnosis not present

## 2015-08-31 DIAGNOSIS — M6281 Muscle weakness (generalized): Secondary | ICD-10-CM | POA: Diagnosis not present

## 2015-08-31 DIAGNOSIS — I10 Essential (primary) hypertension: Secondary | ICD-10-CM | POA: Diagnosis not present

## 2015-08-31 DIAGNOSIS — M549 Dorsalgia, unspecified: Secondary | ICD-10-CM | POA: Diagnosis not present

## 2015-08-31 DIAGNOSIS — Z4800 Encounter for change or removal of nonsurgical wound dressing: Secondary | ICD-10-CM | POA: Diagnosis not present

## 2015-08-31 DIAGNOSIS — Z7951 Long term (current) use of inhaled steroids: Secondary | ICD-10-CM | POA: Diagnosis not present

## 2015-08-31 DIAGNOSIS — I447 Left bundle-branch block, unspecified: Secondary | ICD-10-CM | POA: Diagnosis not present

## 2015-09-03 DIAGNOSIS — Z7951 Long term (current) use of inhaled steroids: Secondary | ICD-10-CM | POA: Diagnosis not present

## 2015-09-03 DIAGNOSIS — M549 Dorsalgia, unspecified: Secondary | ICD-10-CM | POA: Diagnosis not present

## 2015-09-03 DIAGNOSIS — I10 Essential (primary) hypertension: Secondary | ICD-10-CM | POA: Diagnosis not present

## 2015-09-03 DIAGNOSIS — I447 Left bundle-branch block, unspecified: Secondary | ICD-10-CM | POA: Diagnosis not present

## 2015-09-03 DIAGNOSIS — M6281 Muscle weakness (generalized): Secondary | ICD-10-CM | POA: Diagnosis not present

## 2015-09-03 DIAGNOSIS — Z4800 Encounter for change or removal of nonsurgical wound dressing: Secondary | ICD-10-CM | POA: Diagnosis not present

## 2015-09-07 DIAGNOSIS — Z7951 Long term (current) use of inhaled steroids: Secondary | ICD-10-CM | POA: Diagnosis not present

## 2015-09-07 DIAGNOSIS — M6281 Muscle weakness (generalized): Secondary | ICD-10-CM | POA: Diagnosis not present

## 2015-09-07 DIAGNOSIS — M549 Dorsalgia, unspecified: Secondary | ICD-10-CM | POA: Diagnosis not present

## 2015-09-07 DIAGNOSIS — Z4800 Encounter for change or removal of nonsurgical wound dressing: Secondary | ICD-10-CM | POA: Diagnosis not present

## 2015-09-07 DIAGNOSIS — I447 Left bundle-branch block, unspecified: Secondary | ICD-10-CM | POA: Diagnosis not present

## 2015-09-07 DIAGNOSIS — I10 Essential (primary) hypertension: Secondary | ICD-10-CM | POA: Diagnosis not present

## 2015-09-09 DIAGNOSIS — I10 Essential (primary) hypertension: Secondary | ICD-10-CM | POA: Diagnosis not present

## 2015-09-09 DIAGNOSIS — M6281 Muscle weakness (generalized): Secondary | ICD-10-CM | POA: Diagnosis not present

## 2015-09-09 DIAGNOSIS — M549 Dorsalgia, unspecified: Secondary | ICD-10-CM | POA: Diagnosis not present

## 2015-09-09 DIAGNOSIS — I447 Left bundle-branch block, unspecified: Secondary | ICD-10-CM | POA: Diagnosis not present

## 2015-09-09 DIAGNOSIS — Z4800 Encounter for change or removal of nonsurgical wound dressing: Secondary | ICD-10-CM | POA: Diagnosis not present

## 2015-09-09 DIAGNOSIS — Z7951 Long term (current) use of inhaled steroids: Secondary | ICD-10-CM | POA: Diagnosis not present

## 2015-09-10 DIAGNOSIS — I447 Left bundle-branch block, unspecified: Secondary | ICD-10-CM | POA: Diagnosis not present

## 2015-09-10 DIAGNOSIS — M549 Dorsalgia, unspecified: Secondary | ICD-10-CM | POA: Diagnosis not present

## 2015-09-10 DIAGNOSIS — Z7951 Long term (current) use of inhaled steroids: Secondary | ICD-10-CM | POA: Diagnosis not present

## 2015-09-10 DIAGNOSIS — Z4800 Encounter for change or removal of nonsurgical wound dressing: Secondary | ICD-10-CM | POA: Diagnosis not present

## 2015-09-10 DIAGNOSIS — M6281 Muscle weakness (generalized): Secondary | ICD-10-CM | POA: Diagnosis not present

## 2015-09-10 DIAGNOSIS — I10 Essential (primary) hypertension: Secondary | ICD-10-CM | POA: Diagnosis not present

## 2015-09-13 DIAGNOSIS — Z4800 Encounter for change or removal of nonsurgical wound dressing: Secondary | ICD-10-CM | POA: Diagnosis not present

## 2015-09-13 DIAGNOSIS — I447 Left bundle-branch block, unspecified: Secondary | ICD-10-CM | POA: Diagnosis not present

## 2015-09-13 DIAGNOSIS — M6281 Muscle weakness (generalized): Secondary | ICD-10-CM | POA: Diagnosis not present

## 2015-09-13 DIAGNOSIS — Z7951 Long term (current) use of inhaled steroids: Secondary | ICD-10-CM | POA: Diagnosis not present

## 2015-09-13 DIAGNOSIS — M549 Dorsalgia, unspecified: Secondary | ICD-10-CM | POA: Diagnosis not present

## 2015-09-13 DIAGNOSIS — I10 Essential (primary) hypertension: Secondary | ICD-10-CM | POA: Diagnosis not present

## 2015-09-14 DIAGNOSIS — M6281 Muscle weakness (generalized): Secondary | ICD-10-CM | POA: Diagnosis not present

## 2015-09-14 DIAGNOSIS — Z4800 Encounter for change or removal of nonsurgical wound dressing: Secondary | ICD-10-CM | POA: Diagnosis not present

## 2015-09-14 DIAGNOSIS — Z7951 Long term (current) use of inhaled steroids: Secondary | ICD-10-CM | POA: Diagnosis not present

## 2015-09-14 DIAGNOSIS — I447 Left bundle-branch block, unspecified: Secondary | ICD-10-CM | POA: Diagnosis not present

## 2015-09-14 DIAGNOSIS — M549 Dorsalgia, unspecified: Secondary | ICD-10-CM | POA: Diagnosis not present

## 2015-09-14 DIAGNOSIS — I10 Essential (primary) hypertension: Secondary | ICD-10-CM | POA: Diagnosis not present

## 2015-09-15 DIAGNOSIS — Z7951 Long term (current) use of inhaled steroids: Secondary | ICD-10-CM | POA: Diagnosis not present

## 2015-09-15 DIAGNOSIS — I10 Essential (primary) hypertension: Secondary | ICD-10-CM | POA: Diagnosis not present

## 2015-09-15 DIAGNOSIS — M6281 Muscle weakness (generalized): Secondary | ICD-10-CM | POA: Diagnosis not present

## 2015-09-15 DIAGNOSIS — M549 Dorsalgia, unspecified: Secondary | ICD-10-CM | POA: Diagnosis not present

## 2015-09-15 DIAGNOSIS — Z4800 Encounter for change or removal of nonsurgical wound dressing: Secondary | ICD-10-CM | POA: Diagnosis not present

## 2015-09-15 DIAGNOSIS — I447 Left bundle-branch block, unspecified: Secondary | ICD-10-CM | POA: Diagnosis not present

## 2015-09-16 ENCOUNTER — Telehealth: Payer: Self-pay

## 2015-09-16 DIAGNOSIS — I447 Left bundle-branch block, unspecified: Secondary | ICD-10-CM | POA: Diagnosis not present

## 2015-09-16 DIAGNOSIS — M6281 Muscle weakness (generalized): Secondary | ICD-10-CM | POA: Diagnosis not present

## 2015-09-16 DIAGNOSIS — Z7951 Long term (current) use of inhaled steroids: Secondary | ICD-10-CM | POA: Diagnosis not present

## 2015-09-16 DIAGNOSIS — Z4800 Encounter for change or removal of nonsurgical wound dressing: Secondary | ICD-10-CM | POA: Diagnosis not present

## 2015-09-16 DIAGNOSIS — M549 Dorsalgia, unspecified: Secondary | ICD-10-CM | POA: Diagnosis not present

## 2015-09-16 DIAGNOSIS — I10 Essential (primary) hypertension: Secondary | ICD-10-CM | POA: Diagnosis not present

## 2015-09-16 NOTE — Telephone Encounter (Signed)
Please call back Mark from [facility].

## 2015-09-17 NOTE — Telephone Encounter (Signed)
Requesting to continue home health PT for "twice a week for 2 more weeks" ; verbal order given. Dr Genene Churn, if this is not OK , let me know.

## 2015-09-17 NOTE — Telephone Encounter (Signed)
Lm for rtc 

## 2015-09-20 DIAGNOSIS — Z4800 Encounter for change or removal of nonsurgical wound dressing: Secondary | ICD-10-CM | POA: Diagnosis not present

## 2015-09-20 DIAGNOSIS — M549 Dorsalgia, unspecified: Secondary | ICD-10-CM | POA: Diagnosis not present

## 2015-09-20 DIAGNOSIS — Z7951 Long term (current) use of inhaled steroids: Secondary | ICD-10-CM | POA: Diagnosis not present

## 2015-09-20 DIAGNOSIS — I10 Essential (primary) hypertension: Secondary | ICD-10-CM | POA: Diagnosis not present

## 2015-09-20 DIAGNOSIS — M6281 Muscle weakness (generalized): Secondary | ICD-10-CM | POA: Diagnosis not present

## 2015-09-20 DIAGNOSIS — I447 Left bundle-branch block, unspecified: Secondary | ICD-10-CM | POA: Diagnosis not present

## 2015-09-21 ENCOUNTER — Encounter: Payer: Self-pay | Admitting: *Deleted

## 2015-09-22 DIAGNOSIS — Z7951 Long term (current) use of inhaled steroids: Secondary | ICD-10-CM | POA: Diagnosis not present

## 2015-09-22 DIAGNOSIS — M6281 Muscle weakness (generalized): Secondary | ICD-10-CM | POA: Diagnosis not present

## 2015-09-22 DIAGNOSIS — M549 Dorsalgia, unspecified: Secondary | ICD-10-CM | POA: Diagnosis not present

## 2015-09-22 DIAGNOSIS — Z4800 Encounter for change or removal of nonsurgical wound dressing: Secondary | ICD-10-CM | POA: Diagnosis not present

## 2015-09-22 DIAGNOSIS — I447 Left bundle-branch block, unspecified: Secondary | ICD-10-CM | POA: Diagnosis not present

## 2015-09-22 DIAGNOSIS — I10 Essential (primary) hypertension: Secondary | ICD-10-CM | POA: Diagnosis not present

## 2015-09-29 DIAGNOSIS — Z7951 Long term (current) use of inhaled steroids: Secondary | ICD-10-CM | POA: Diagnosis not present

## 2015-09-29 DIAGNOSIS — Z4800 Encounter for change or removal of nonsurgical wound dressing: Secondary | ICD-10-CM | POA: Diagnosis not present

## 2015-09-29 DIAGNOSIS — M6281 Muscle weakness (generalized): Secondary | ICD-10-CM | POA: Diagnosis not present

## 2015-09-29 DIAGNOSIS — M549 Dorsalgia, unspecified: Secondary | ICD-10-CM | POA: Diagnosis not present

## 2015-09-29 DIAGNOSIS — I447 Left bundle-branch block, unspecified: Secondary | ICD-10-CM | POA: Diagnosis not present

## 2015-09-29 DIAGNOSIS — I10 Essential (primary) hypertension: Secondary | ICD-10-CM | POA: Diagnosis not present

## 2015-10-01 DIAGNOSIS — Z7951 Long term (current) use of inhaled steroids: Secondary | ICD-10-CM | POA: Diagnosis not present

## 2015-10-01 DIAGNOSIS — Z4800 Encounter for change or removal of nonsurgical wound dressing: Secondary | ICD-10-CM | POA: Diagnosis not present

## 2015-10-01 DIAGNOSIS — M6281 Muscle weakness (generalized): Secondary | ICD-10-CM | POA: Diagnosis not present

## 2015-10-01 DIAGNOSIS — I10 Essential (primary) hypertension: Secondary | ICD-10-CM | POA: Diagnosis not present

## 2015-10-01 DIAGNOSIS — M549 Dorsalgia, unspecified: Secondary | ICD-10-CM | POA: Diagnosis not present

## 2015-10-01 DIAGNOSIS — I447 Left bundle-branch block, unspecified: Secondary | ICD-10-CM | POA: Diagnosis not present

## 2015-10-05 DIAGNOSIS — H04122 Dry eye syndrome of left lacrimal gland: Secondary | ICD-10-CM | POA: Diagnosis not present

## 2015-10-05 DIAGNOSIS — H02401 Unspecified ptosis of right eyelid: Secondary | ICD-10-CM | POA: Diagnosis not present

## 2015-10-05 DIAGNOSIS — I10 Essential (primary) hypertension: Secondary | ICD-10-CM | POA: Diagnosis not present

## 2015-10-05 DIAGNOSIS — H04121 Dry eye syndrome of right lacrimal gland: Secondary | ICD-10-CM | POA: Diagnosis not present

## 2015-10-05 DIAGNOSIS — H353130 Nonexudative age-related macular degeneration, bilateral, stage unspecified: Secondary | ICD-10-CM | POA: Diagnosis not present

## 2015-11-22 ENCOUNTER — Encounter: Payer: Medicare Other | Admitting: Internal Medicine

## 2015-12-27 ENCOUNTER — Encounter: Payer: Medicare Other | Admitting: Internal Medicine

## 2016-02-03 DIAGNOSIS — Z23 Encounter for immunization: Secondary | ICD-10-CM | POA: Diagnosis not present

## 2016-02-07 ENCOUNTER — Encounter: Payer: Self-pay | Admitting: Internal Medicine

## 2016-02-07 ENCOUNTER — Ambulatory Visit (INDEPENDENT_AMBULATORY_CARE_PROVIDER_SITE_OTHER): Payer: Medicare Other | Admitting: Internal Medicine

## 2016-02-07 VITALS — BP 164/90 | HR 72 | Temp 98.2°F | Ht 60.0 in | Wt 147.6 lb

## 2016-02-07 DIAGNOSIS — Z87891 Personal history of nicotine dependence: Secondary | ICD-10-CM | POA: Diagnosis not present

## 2016-02-07 DIAGNOSIS — I10 Essential (primary) hypertension: Secondary | ICD-10-CM | POA: Diagnosis present

## 2016-02-07 DIAGNOSIS — R0981 Nasal congestion: Secondary | ICD-10-CM

## 2016-02-07 MED ORDER — RAMIPRIL 5 MG PO CAPS
5.0000 mg | ORAL_CAPSULE | Freq: Every day | ORAL | 3 refills | Status: DC
Start: 2016-02-07 — End: 2016-09-25

## 2016-02-07 NOTE — Progress Notes (Signed)
   CC: follow up of HTN  HPI:  [name] is a 82 y.o. LBBB, HTN, and sponteanous pnx is here for follow up of HTN.    Past Medical History:  Diagnosis Date  . LBBB (left bundle branch block)    HTN: on ramipril 2.5mg  daily. Has not taken it today. At home his BP is well controlled per him.   He is accompanied by his next door neighbor who he depends on for transportation needs to his appts and also to the pharmacy. I discussed PACE as an option for him. He is interested to talk to PACE.  Has some nasal congestion.   He denies any other complaints right now.  Review of Systems:   Review of Systems  Constitutional: Negative for chills and fever.  HENT: Positive for congestion.   Respiratory: Negative for cough and sputum production.   Cardiovascular: Negative for chest pain and palpitations.  Gastrointestinal: Negative for heartburn, nausea and vomiting.  Genitourinary: Negative for dysuria.  Skin: Negative for rash.  Neurological: Negative for dizziness.     Physical Exam:  Vitals:   02/07/16 1549  BP: (!) 170/73  Pulse: 72  Temp: 97.8 F (36.6 C)  TempSrc: Oral  SpO2: 100%  Weight: 147 lb 9.6 oz (67 kg)  Height: 5' (1.524 m)   Physical Exam  Constitutional: He is oriented to person, place, and time.  Elderly frail appearing male.   HENT:  Head: Normocephalic and atraumatic.  Mouth/Throat: Oropharynx is clear and moist.  Cardiovascular:  Systolic murmur  Respiratory: Effort normal and breath sounds normal. No respiratory distress. He has no wheezes.  GI: Soft. Bowel sounds are normal. He exhibits no distension. There is no tenderness.  Neurological: He is alert and oriented to person, place, and time.    Assessment & Plan:   See Encounters Tab for problem based charting.  Patient discussed with Dr. Dareen Piano

## 2016-02-07 NOTE — Patient Instructions (Addendum)
Start taking Ramipril 5mg  daily (not 2.5mg ).  Think about PACE, we can see you in the mean time.   Follow up in 3 months.

## 2016-02-07 NOTE — Assessment & Plan Note (Signed)
Recommended buying a humidifier  Also asked to use netipot.

## 2016-02-07 NOTE — Assessment & Plan Note (Signed)
Vitals:   02/07/16 1549 02/07/16 1622  BP: (!) 170/73 (!) 164/90  Pulse: 72   Temp: 97.8 F (36.6 C)    Currently on ramipril 2.5mg  daily (I prescribed 5 mg last time but he is taking 2.5mg  daily). Did not take his dose today. BP remains elevated.  Asked him to continue 5mg . Re-sent the prescript to his pharmacy.  F/up in 3 months.

## 2016-02-10 NOTE — Progress Notes (Signed)
Internal Medicine Clinic Attending  Case discussed with Dr. Ahmed at the time of the visit.  We reviewed the resident's history and exam and pertinent patient test results.  I agree with the assessment, diagnosis, and plan of care documented in the resident's note. 

## 2016-04-19 ENCOUNTER — Encounter: Payer: Medicare Other | Admitting: Internal Medicine

## 2016-04-28 DIAGNOSIS — H26491 Other secondary cataract, right eye: Secondary | ICD-10-CM | POA: Diagnosis not present

## 2016-04-28 DIAGNOSIS — H04123 Dry eye syndrome of bilateral lacrimal glands: Secondary | ICD-10-CM | POA: Diagnosis not present

## 2016-04-28 DIAGNOSIS — I10 Essential (primary) hypertension: Secondary | ICD-10-CM | POA: Diagnosis not present

## 2016-04-28 DIAGNOSIS — H02401 Unspecified ptosis of right eyelid: Secondary | ICD-10-CM | POA: Diagnosis not present

## 2016-07-07 DIAGNOSIS — H35033 Hypertensive retinopathy, bilateral: Secondary | ICD-10-CM | POA: Diagnosis not present

## 2016-07-07 DIAGNOSIS — H43811 Vitreous degeneration, right eye: Secondary | ICD-10-CM | POA: Diagnosis not present

## 2016-07-07 DIAGNOSIS — H35423 Microcystoid degeneration of retina, bilateral: Secondary | ICD-10-CM | POA: Diagnosis not present

## 2016-07-07 DIAGNOSIS — H353134 Nonexudative age-related macular degeneration, bilateral, advanced atrophic with subfoveal involvement: Secondary | ICD-10-CM | POA: Diagnosis not present

## 2016-08-04 ENCOUNTER — Ambulatory Visit: Payer: Medicare Other | Admitting: Cardiovascular Disease

## 2016-09-25 ENCOUNTER — Ambulatory Visit
Admission: RE | Admit: 2016-09-25 | Discharge: 2016-09-25 | Disposition: A | Discharge status: Home / Self Care | Payer: Medicare Other | Source: Ambulatory Visit | Attending: Cardiovascular Disease | Admitting: Cardiovascular Disease

## 2016-09-25 ENCOUNTER — Ambulatory Visit (INDEPENDENT_AMBULATORY_CARE_PROVIDER_SITE_OTHER): Payer: Medicare Other | Admitting: Cardiovascular Disease

## 2016-09-25 ENCOUNTER — Encounter: Payer: Self-pay | Admitting: Cardiovascular Disease

## 2016-09-25 VITALS — BP 132/72 | HR 97 | Ht 60.0 in | Wt 139.6 lb

## 2016-09-25 DIAGNOSIS — I10 Essential (primary) hypertension: Secondary | ICD-10-CM

## 2016-09-25 DIAGNOSIS — R0989 Other specified symptoms and signs involving the circulatory and respiratory systems: Principal | ICD-10-CM

## 2016-09-25 DIAGNOSIS — R0689 Other abnormalities of breathing: Secondary | ICD-10-CM

## 2016-09-25 DIAGNOSIS — Z13220 Encounter for screening for lipoid disorders: Secondary | ICD-10-CM | POA: Diagnosis not present

## 2016-09-25 DIAGNOSIS — I447 Left bundle-branch block, unspecified: Secondary | ICD-10-CM | POA: Diagnosis not present

## 2016-09-25 DIAGNOSIS — J681 Pulmonary edema due to chemicals, gases, fumes and vapors: Secondary | ICD-10-CM | POA: Diagnosis not present

## 2016-09-25 LAB — LIPID PANEL
CHOLESTEROL TOTAL: 153 mg/dL (ref 100–199)
Chol/HDL Ratio: 2.6 ratio (ref 0.0–5.0)
HDL: 60 mg/dL (ref >39)
LDL Calculated: 77 mg/dL (ref 0–99)
Triglycerides: 82 mg/dL (ref 0–149)
VLDL CHOLESTEROL CAL: 16 mg/dL (ref 5–40)

## 2016-09-25 LAB — COMPREHENSIVE METABOLIC PANEL
ALBUMIN: 4.3 g/dL (ref 3.2–4.6)
ALT: 15 IU/L (ref 0–44)
AST: 23 IU/L (ref 0–40)
Albumin/Globulin Ratio: 1.8 (ref 1.2–2.2)
Alkaline Phosphatase: 110 IU/L (ref 39–117)
BUN / CREAT RATIO: 24 (ref 10–24)
BUN: 28 mg/dL (ref 10–36)
Bilirubin Total: 0.5 mg/dL (ref 0.0–1.2)
CALCIUM: 9.6 mg/dL (ref 8.6–10.2)
CO2: 23 mmol/L (ref 18–29)
CREATININE: 1.16 mg/dL (ref 0.76–1.27)
Chloride: 100 mmol/L (ref 96–106)
GFR calc Af Amer: 63 mL/min/{1.73_m2} (ref >59)
GFR, EST NON AFRICAN AMERICAN: 55 mL/min/{1.73_m2} — AB (ref >59)
GLOBULIN, TOTAL: 2.4 g/dL (ref 1.5–4.5)
GLUCOSE: 93 mg/dL (ref 65–99)
Potassium: 4.4 mmol/L (ref 3.5–5.2)
SODIUM: 141 mmol/L (ref 134–144)
TOTAL PROTEIN: 6.7 g/dL (ref 6.0–8.5)

## 2016-09-25 MED ORDER — RAMIPRIL 2.5 MG PO CAPS: 2.5000 mg | ORAL_CAPSULE | Freq: Every day | ORAL | 3 refills | Status: DC
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
## 2016-09-25 NOTE — Patient Instructions (Addendum)
Medication Instructions:  Your physician recommends that you continue on your current medications as directed. Please refer to the Current Medication list given to you today.   Labwork: TODAY - cholesterol, complete metabolic panel   Testing/Procedures: A chest x-ray takes a picture of the organs and structures inside the chest, including the heart, lungs, and blood vessels. This test can show several things, including, whether the heart is enlarges; whether fluid is building up in the lungs; and whether pacemaker / defibrillator leads are still in place. Go to 1st floor of [facility] building at [facility] for xray   Follow-Up: Your physician wants you to follow-up in: 6 months with Dr. Acie Fredrickson.  You will receive a reminder letter in the mail two months in advance. If you don't receive a letter, please call our office to schedule the follow-up appointment.   If you need a refill on your cardiac medications before your next appointment, please call your pharmacy.   Thank you for choosing [facility]! Christen Bame, RN [phone]

## 2016-10-02 ENCOUNTER — Telehealth: Payer: Self-pay | Admitting: Cardiovascular Disease

## 2016-10-02 NOTE — Telephone Encounter (Signed)
New message     Pt said Dr Ernie Hew is not accepting new medicare patient, so he would like a list of PCP takes medicare

## 2016-10-02 NOTE — Telephone Encounter (Signed)
Called patient and gave him the phone number for [facility] Physician referral, [phone]. He verbalized understanding and thanked me for the call

## 2017-02-23 DIAGNOSIS — Z23 Encounter for immunization: Secondary | ICD-10-CM | POA: Diagnosis not present

## 2017-03-13 ENCOUNTER — Telehealth: Payer: Self-pay | Admitting: Cardiovascular Disease

## 2017-03-13 NOTE — Telephone Encounter (Signed)
New message  Pt wants morning appt and he wants December 3,4,10 or the 11th   in the am only before 12pm

## 2017-03-14 NOTE — Telephone Encounter (Signed)
Spoke with patient and offered him appointment on 12/11. He verbalized agreement with this date and time and thanked me for the call.

## 2017-05-01 ENCOUNTER — Ambulatory Visit: Payer: Medicare Other | Admitting: Cardiovascular Disease

## 2017-05-10 DIAGNOSIS — M4126 Other idiopathic scoliosis, lumbar region: Secondary | ICD-10-CM | POA: Diagnosis not present

## 2017-05-10 DIAGNOSIS — M47816 Spondylosis without myelopathy or radiculopathy, lumbar region: Secondary | ICD-10-CM | POA: Diagnosis not present

## 2017-05-10 DIAGNOSIS — M549 Dorsalgia, unspecified: Secondary | ICD-10-CM | POA: Diagnosis not present

## 2017-05-10 DIAGNOSIS — Z6824 Body mass index (BMI) 24.0-24.9, adult: Secondary | ICD-10-CM | POA: Diagnosis not present

## 2017-07-17 ENCOUNTER — Ambulatory Visit (INDEPENDENT_AMBULATORY_CARE_PROVIDER_SITE_OTHER): Payer: Medicare Other | Admitting: Cardiovascular Disease

## 2017-07-17 ENCOUNTER — Encounter: Payer: Self-pay | Admitting: Cardiovascular Disease

## 2017-07-17 VITALS — BP 126/80 | HR 80 | Ht 65.0 in | Wt 132.8 lb

## 2017-07-17 DIAGNOSIS — I10 Essential (primary) hypertension: Secondary | ICD-10-CM

## 2017-07-17 DIAGNOSIS — I447 Left bundle-branch block, unspecified: Secondary | ICD-10-CM

## 2017-07-17 LAB — HEPATIC FUNCTION PANEL
ALBUMIN: 4.3 g/dL (ref 3.2–4.6)
ALT: 16 IU/L (ref 0–44)
AST: 20 IU/L (ref 0–40)
Alkaline Phosphatase: 86 IU/L (ref 39–117)
Bilirubin Total: 0.6 mg/dL (ref 0.0–1.2)
Bilirubin, Direct: 0.18 mg/dL (ref 0.00–0.40)
TOTAL PROTEIN: 6.8 g/dL (ref 6.0–8.5)

## 2017-07-17 LAB — BASIC METABOLIC PANEL
BUN/Creatinine Ratio: 19 (ref 10–24)
BUN: 25 mg/dL (ref 10–36)
CALCIUM: 9.6 mg/dL (ref 8.6–10.2)
CHLORIDE: 100 mmol/L (ref 96–106)
CO2: 24 mmol/L (ref 20–29)
Creatinine, Ser: 1.34 mg/dL — ABNORMAL HIGH (ref 0.76–1.27)
GFR calc Af Amer: 53 mL/min/{1.73_m2} — ABNORMAL LOW (ref >59)
GFR, EST NON AFRICAN AMERICAN: 46 mL/min/{1.73_m2} — AB (ref >59)
GLUCOSE: 101 mg/dL — AB (ref 65–99)
POTASSIUM: 4.5 mmol/L (ref 3.5–5.2)
Sodium: 141 mmol/L (ref 134–144)

## 2017-07-17 LAB — CBC
HEMATOCRIT: 37.8 % (ref 37.5–51.0)
HEMOGLOBIN: 13.2 g/dL (ref 13.0–17.7)
MCH: 31.4 pg (ref 26.6–33.0)
MCHC: 34.9 g/dL (ref 31.5–35.7)
MCV: 90 fL (ref 79–97)
Platelets: 208 10*3/uL (ref 150–379)
RBC: 4.2 x10E6/uL (ref 4.14–5.80)
RDW: 13.5 % (ref 12.3–15.4)
WBC: 8.2 10*3/uL (ref 3.4–10.8)

## 2017-07-17 LAB — TSH: TSH: 5.45 u[IU]/mL — ABNORMAL HIGH (ref 0.450–4.500)

## 2017-07-17 MED ORDER — RAMIPRIL 2.5 MG PO CAPS: 2.5000 mg | ORAL_CAPSULE | Freq: Every day | ORAL | 3 refills | Status: DC
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
## 2017-07-17 NOTE — Patient Instructions (Signed)
Medication Instructions:  Your physician recommends that you continue on your current medications as directed. Please refer to the Current Medication list given to you today.   Labwork: TODAY  - CBC, BMET, liver panel, TSH   Testing/Procedures: None Ordered   Follow-Up: Your physician wants you to follow-up in: 6 months with Dr. Acie Fredrickson.  You will receive a reminder letter in the mail two months in advance. If you don't receive a letter, please call our office to schedule the follow-up appointment.   If you need a refill on your cardiac medications before your next appointment, please call your pharmacy.   Thank you for choosing [facility]! Christen Bame, RN [phone]

## 2017-07-19 ENCOUNTER — Telehealth: Payer: Self-pay | Admitting: Cardiovascular Disease

## 2017-07-19 LAB — T4, FREE: FREE T4: 1.18 ng/dL (ref 0.82–1.77)

## 2017-07-19 LAB — SPECIMEN STATUS REPORT

## 2017-07-19 LAB — T3, FREE: T3 FREE: 2.5 pg/mL (ref 2.0–4.4)

## 2017-07-19 NOTE — Telephone Encounter (Signed)
New Message   Patient is calling back in reference to his lab results. Please call to discuss.

## 2017-07-19 NOTE — Telephone Encounter (Signed)
Patient made aware of normal lab results and provided patient with the [facility] number to get established with a pcp. Patient in agreement with plan and thankful for the call

## 2018-01-15 ENCOUNTER — Encounter: Payer: Self-pay | Admitting: Cardiology

## 2018-01-15 ENCOUNTER — Ambulatory Visit (INDEPENDENT_AMBULATORY_CARE_PROVIDER_SITE_OTHER): Payer: Medicare Other | Admitting: Cardiology

## 2018-01-15 ENCOUNTER — Encounter (INDEPENDENT_AMBULATORY_CARE_PROVIDER_SITE_OTHER): Payer: Self-pay

## 2018-01-15 VITALS — BP 118/60 | HR 58 | Ht 64.0 in | Wt 130.4 lb

## 2018-01-15 DIAGNOSIS — I10 Essential (primary) hypertension: Secondary | ICD-10-CM | POA: Diagnosis not present

## 2018-01-15 LAB — BASIC METABOLIC PANEL
BUN / CREAT RATIO: 19 (ref 10–24)
BUN: 31 mg/dL (ref 10–36)
CO2: 22 mmol/L (ref 20–29)
Calcium: 9.4 mg/dL (ref 8.6–10.2)
Chloride: 99 mmol/L (ref 96–106)
Creatinine, Ser: 1.65 mg/dL — ABNORMAL HIGH (ref 0.76–1.27)
GFR calc Af Amer: 41 mL/min/{1.73_m2} — ABNORMAL LOW (ref >59)
GFR calc non Af Amer: 36 mL/min/{1.73_m2} — ABNORMAL LOW (ref >59)
GLUCOSE: 102 mg/dL — AB (ref 65–99)
POTASSIUM: 4.7 mmol/L (ref 3.5–5.2)
SODIUM: 141 mmol/L (ref 134–144)

## 2018-01-15 NOTE — Patient Instructions (Signed)
Medication Instructions: Your physician recommends that you continue on your current medications as directed. Please refer to the Current Medication list given to you today.   Labwork: TODAY: BMET  Procedures/Testing: None Ordered  Follow-Up: Your physician wants you to follow-up in: 6 months with Dr. Vilinda Boehringer will receive a reminder letter in the mail two months in advance. If you don't receive a letter, please call our office to schedule the follow-up appointment.   Any Additional Special Instructions Will Be Listed Below (If Applicable).  YOUR PHYSICIAN HAS RECOMMEND THAT YOU GET A PRIMARY CARE DOCTOR, YOU CAN CALL [facility] AT  [phone] AND THEY WILL GIVE YOU A LIST OF THE PROVIDERS IN YOUR AREA     If you need a refill on your cardiac medications before your next appointment, please call your pharmacy.

## 2018-01-15 NOTE — Progress Notes (Signed)
Cardiology Office Note:    Date:  01/15/2018   ID:  Lawrence Hicks, DOB 1923/12/19, MRN [id number]  PCP:  Patient, No Pcp Per  Cardiologist:  Mertie Moores, MD  Referring MD: No ref. provider found   Chief Complaint  Patient presents with  . Follow-up    Hypertension    History of Present Illness:    Lawrence Hicks is a 84 y.o. male with a past medical history significant for LBBB and hypertension.  He had a normal echocardiogram in 2014.  He had a spontaneous pneumothorax in 2018.  He has been advised several times to get established with a primary care provider.  He is here for his semiannual follow-up with a family friend. He lives alone and does his housework and light cooking. He no longer drives due to his arthritis in his back. He denies chest pain. He gets a little winded when he walks a lot, but nothing unusual. He denies lightheadedness, palpitations, orthopnea, PND, or edema.  He is complaining of mucus in his throat. He feels that it is from his nose. No cough or fever. He has been out of Flonase for about a month. Advise to resume the Flonase.   BP high on presentation but came down to 118/60 after rest.   Past Medical History:  Diagnosis Date  . LBBB (left bundle branch block)     Past Surgical History:  Procedure Laterality Date  . CYST EXCISION     84 yrs old    Current Medications: Current Meds  Medication Sig  . acetaminophen (TYLENOL) 325 MG tablet Take 650 mg by mouth every 6 (six) hours as needed for mild pain.  . [facility] COUGH DM 30 MG/5ML liquid TAKE 5 MLS (30 MG TOTAL) BY MOUTH 2 (TWO) TIMES DAILY AS NEEDED FOR COUGH.  . fluticasone (FLONASE) 50 MCG/ACT nasal spray Place 1 spray into both nostrils 2 (two) times daily.  Marland Kitchen OVER THE COUNTER MEDICATION Take 1 tablet by mouth daily. OTC medication for anxiety - Patient unsure of name  . ramipril (ALTACE) 2.5 MG capsule Take 1 capsule (2.5 mg total) by mouth daily.     Allergies:   Coreg [carvedilol]    Social History   Socioeconomic History  . Marital status: Single    Spouse name: Not on file  . Number of children: Not on file  . Years of education: Not on file  . Highest education level: Not on file  Occupational History  . Not on file  Social Needs  . Financial resource strain: Not on file  . Food insecurity:    Worry: Not on file    Inability: Not on file  . Transportation needs:    Medical: Not on file    Non-medical: Not on file  Tobacco Use  . Smoking status: Former Smoker    Last attempt to quit: 08/10/1955    Years since quitting: 62.4  . Smokeless tobacco: Never Used  . Tobacco comment: 50 yrs ago  Substance and Sexual Activity  . Alcohol use: Not on file  . Drug use: Not on file  . Sexual activity: Not on file  Lifestyle  . Physical activity:    Days per week: Not on file    Minutes per session: Not on file  . Stress: Not on file  Relationships  . Social connections:    Talks on phone: Not on file    Gets together: Not on file    Attends religious service: Not on  file    Active member of club or organization: Not on file    Attends meetings of clubs or organizations: Not on file    Relationship status: Not on file  Other Topics Concern  . Not on file  Social History Narrative  . Not on file     Family History: The patient's Family history is unknown by patient. ROS:   Please see the history of present illness.     All other systems reviewed and are negative.  EKGs/Labs/Other Studies Reviewed:    The following studies were reviewed today:  Echocardiogram 01/01/2013 Study Conclusions - Left ventricle: The cavity size was normal. Wall thickness was increased in a pattern of moderate LVH. Systolic function was normal. The estimated ejection fraction was in the range of 55% to 60%. - Aortic valve: Mild regurgitation. - Left atrium: The atrium was mildly dilated. - Atrial septum: No defect or patent foramen ovale was identified. -  Pulmonary arteries: PA peak pressure: 28mm Hg (S).  EKG:  EKG is not ordered today.    Recent Labs: 07/17/2017: ALT 16; BUN 25; Creatinine, Ser 1.34; Hemoglobin 13.2; Platelets 208; Potassium 4.5; Sodium 141; TSH 5.450   Recent Lipid Panel    Component Value Date/Time   CHOL 153 09/25/2016 1045   TRIG 82 09/25/2016 1045   HDL 60 09/25/2016 1045   CHOLHDL 2.6 09/25/2016 1045   CHOLHDL 2.6 07/20/2015 1130   VLDL 19 07/20/2015 1130   LDLCALC 77 09/25/2016 1045    Physical Exam:    VS:  BP 118/60   Pulse (!) 58   Ht 5\' 4"  (1.626 m)   Wt 130 lb 6.4 oz (59.1 kg)   SpO2 97%   BMI 22.38 kg/m     Wt Readings from Last 3 Encounters:  01/15/18 130 lb 6.4 oz (59.1 kg)  07/17/17 132 lb 12.8 oz (60.2 kg)  09/25/16 139 lb 9.6 oz (63.3 kg)     Physical Exam  Constitutional: He is oriented to person, place, and time. He appears well-developed and well-nourished. No distress.  HENT:  Head: Normocephalic and atraumatic.  Hard of hearing  Neck: No JVD present.  Cardiovascular: Normal rate, regular rhythm and intact distal pulses. Exam reveals no gallop and no friction rub.  Murmur heard.  Systolic murmur is present with a grade of 1/6 at the upper left sternal border. Pulmonary/Chest: Effort normal and breath sounds normal. No respiratory distress. He has no wheezes. He has no rales.  Diminished in the bases  Abdominal: Soft. Bowel sounds are normal.  Musculoskeletal: He exhibits deformity. He exhibits no edema.  Extreme kyphosis  Neurological: He is alert and oriented to person, place, and time.  Skin: Skin is warm and dry.  Psychiatric: He has a normal mood and affect. His behavior is normal. Thought content normal.  Vitals reviewed.    ASSESSMENT:    1. Essential hypertension    PLAN:    In order of problems listed above:  Hypertension: Managed with Altace 2.5 mg daily. Initially BP elevated after his struggle to walk in from the parking deck. He has extreme kyphosis  with increased effort for walking. Repeat BP normal at 118/60. Continue current therapy. I will check a BMet since he does have a PCP.   Pt still does not have a PCP. He says he has been turned down due to his age and being on Medicare. Information for [facility] given and pt instructed to call and request a PCP.  Left bundle branch block: Chronic, stable.   Medication Adjustments/Labs and Tests Ordered: Current medicines are reviewed at length with the patient today.  Concerns regarding medicines are outlined above. Labs and tests ordered and medication changes are outlined in the patient instructions below:  Patient Instructions  Medication Instructions: Your physician recommends that you continue on your current medications as directed. Please refer to the Current Medication list given to you today.   Labwork: TODAY: BMET  Procedures/Testing: None Ordered  Follow-Up: Your physician wants you to follow-up in: 6 months with Dr. Vilinda Boehringer will receive a reminder letter in the mail two months in advance. If you don't receive a letter, please call our office to schedule the follow-up appointment.   Any Additional Special Instructions Will Be Listed Below (If Applicable).  YOUR PHYSICIAN HAS RECOMMEND THAT YOU GET A PRIMARY CARE DOCTOR, YOU CAN CALL [facility] AT  [phone] AND THEY WILL GIVE YOU A LIST OF THE PROVIDERS IN YOUR AREA     If you need a refill on your cardiac medications before your next appointment, please call your pharmacy.      Signed, Daune Perch, NP  01/15/2018 10:35 AM    [place]

## 2018-01-17 ENCOUNTER — Telehealth: Payer: Self-pay | Admitting: Cardiology

## 2018-01-17 ENCOUNTER — Other Ambulatory Visit: Payer: Self-pay

## 2018-01-17 DIAGNOSIS — I10 Essential (primary) hypertension: Secondary | ICD-10-CM

## 2018-01-17 DIAGNOSIS — E860 Dehydration: Secondary | ICD-10-CM

## 2018-01-17 NOTE — Telephone Encounter (Signed)
Spoke to patient about lab results and recommendations.  He was informed to drink more water and have labs drawn 9/24.  The patient verbalized understanding.

## 2018-01-17 NOTE — Telephone Encounter (Signed)
New Message:      Pt is calling to get some lab results

## 2018-02-12 ENCOUNTER — Other Ambulatory Visit: Payer: Medicare Other

## 2018-02-12 DIAGNOSIS — I10 Essential (primary) hypertension: Secondary | ICD-10-CM

## 2018-02-12 DIAGNOSIS — E860 Dehydration: Secondary | ICD-10-CM

## 2018-02-12 LAB — BASIC METABOLIC PANEL
BUN/Creatinine Ratio: 20 (ref 10–24)
BUN: 34 mg/dL (ref 10–36)
CALCIUM: 9.4 mg/dL (ref 8.6–10.2)
CHLORIDE: 102 mmol/L (ref 96–106)
CO2: 22 mmol/L (ref 20–29)
Creatinine, Ser: 1.74 mg/dL — ABNORMAL HIGH (ref 0.76–1.27)
GFR calc Af Amer: 38 mL/min/{1.73_m2} — ABNORMAL LOW (ref >59)
GFR calc non Af Amer: 33 mL/min/{1.73_m2} — ABNORMAL LOW (ref >59)
GLUCOSE: 100 mg/dL — AB (ref 65–99)
POTASSIUM: 4.6 mmol/L (ref 3.5–5.2)
Sodium: 144 mmol/L (ref 134–144)

## 2018-03-26 DIAGNOSIS — Z23 Encounter for immunization: Secondary | ICD-10-CM | POA: Diagnosis not present

## 2018-04-02 DIAGNOSIS — H353133 Nonexudative age-related macular degeneration, bilateral, advanced atrophic without subfoveal involvement: Secondary | ICD-10-CM | POA: Diagnosis not present

## 2018-04-02 DIAGNOSIS — H5213 Myopia, bilateral: Secondary | ICD-10-CM | POA: Diagnosis not present

## 2018-07-23 ENCOUNTER — Other Ambulatory Visit: Payer: Self-pay | Admitting: Cardiovascular Disease

## 2018-09-21 ENCOUNTER — Emergency Department (HOSPITAL_COMMUNITY): Payer: Medicare Other

## 2018-09-21 ENCOUNTER — Other Ambulatory Visit: Payer: Self-pay

## 2018-09-21 ENCOUNTER — Emergency Department (HOSPITAL_COMMUNITY)
Admission: EM | Admit: 2018-09-21 | Discharge: 2018-10-11 | Disposition: A | Discharge status: Home / Self Care | Payer: Medicare Other | Attending: Emergency Medicine | Admitting: Emergency Medicine

## 2018-09-21 ENCOUNTER — Encounter (HOSPITAL_COMMUNITY): Payer: Self-pay | Admitting: Emergency Medicine

## 2018-09-21 DIAGNOSIS — Z87891 Personal history of nicotine dependence: Secondary | ICD-10-CM | POA: Insufficient documentation

## 2018-09-21 DIAGNOSIS — Z20828 Contact with and (suspected) exposure to other viral communicable diseases: Secondary | ICD-10-CM | POA: Diagnosis not present

## 2018-09-21 DIAGNOSIS — Z79899 Other long term (current) drug therapy: Secondary | ICD-10-CM | POA: Diagnosis not present

## 2018-09-21 DIAGNOSIS — Z008 Encounter for other general examination: Secondary | ICD-10-CM

## 2018-09-21 DIAGNOSIS — R413 Other amnesia: Secondary | ICD-10-CM | POA: Diagnosis not present

## 2018-09-21 DIAGNOSIS — J9811 Atelectasis: Secondary | ICD-10-CM | POA: Diagnosis not present

## 2018-09-21 DIAGNOSIS — I10 Essential (primary) hypertension: Secondary | ICD-10-CM | POA: Diagnosis not present

## 2018-09-21 DIAGNOSIS — F0390 Unspecified dementia without behavioral disturbance: Secondary | ICD-10-CM | POA: Insufficient documentation

## 2018-09-21 DIAGNOSIS — Z0489 Encounter for examination and observation for other specified reasons: Secondary | ICD-10-CM | POA: Diagnosis not present

## 2018-09-21 DIAGNOSIS — R456 Violent behavior: Secondary | ICD-10-CM | POA: Diagnosis not present

## 2018-09-21 DIAGNOSIS — R4689 Other symptoms and signs involving appearance and behavior: Secondary | ICD-10-CM

## 2018-09-21 DIAGNOSIS — R4182 Altered mental status, unspecified: Secondary | ICD-10-CM | POA: Diagnosis not present

## 2018-09-21 DIAGNOSIS — R410 Disorientation, unspecified: Secondary | ICD-10-CM

## 2018-09-21 DIAGNOSIS — F29 Unspecified psychosis not due to a substance or known physiological condition: Secondary | ICD-10-CM | POA: Diagnosis not present

## 2018-09-21 LAB — CBC WITH DIFFERENTIAL/PLATELET
Abs Immature Granulocytes: 0.04 10*3/uL (ref 0.00–0.07)
Basophils Absolute: 0 10*3/uL (ref 0.0–0.1)
Basophils Relative: 1 %
Eosinophils Absolute: 0.1 10*3/uL (ref 0.0–0.5)
Eosinophils Relative: 1 %
HCT: 33.5 % — ABNORMAL LOW (ref 39.0–52.0)
Hemoglobin: 10.7 g/dL — ABNORMAL LOW (ref 13.0–17.0)
Immature Granulocytes: 1 %
Lymphocytes Relative: 15 %
Lymphs Abs: 1.1 10*3/uL (ref 0.7–4.0)
MCH: 30.8 pg (ref 26.0–34.0)
MCHC: 31.9 g/dL (ref 30.0–36.0)
MCV: 96.5 fL (ref 80.0–100.0)
Monocytes Absolute: 0.7 10*3/uL (ref 0.1–1.0)
Monocytes Relative: 9 %
Neutro Abs: 5.8 10*3/uL (ref 1.7–7.7)
Neutrophils Relative %: 73 %
Platelets: 187 10*3/uL (ref 150–400)
RBC: 3.47 MIL/uL — ABNORMAL LOW (ref 4.22–5.81)
RDW: 14.1 % (ref 11.5–15.5)
WBC: 7.8 10*3/uL (ref 4.0–10.5)
nRBC: 0 % (ref 0.0–0.2)

## 2018-09-21 LAB — COMPREHENSIVE METABOLIC PANEL
ALT: 17 U/L (ref 0–44)
AST: 19 U/L (ref 15–41)
Albumin: 3.8 g/dL (ref 3.5–5.0)
Alkaline Phosphatase: 90 U/L (ref 38–126)
Anion gap: 10 (ref 5–15)
BUN: 41 mg/dL — ABNORMAL HIGH (ref 8–23)
CO2: 25 mmol/L (ref 22–32)
Calcium: 9 mg/dL (ref 8.9–10.3)
Chloride: 106 mmol/L (ref 98–111)
Creatinine, Ser: 2.19 mg/dL — ABNORMAL HIGH (ref 0.61–1.24)
GFR calc Af Amer: 29 mL/min — ABNORMAL LOW (ref >60)
GFR calc non Af Amer: 25 mL/min — ABNORMAL LOW (ref >60)
Glucose, Bld: 107 mg/dL — ABNORMAL HIGH (ref 70–99)
Potassium: 4.4 mmol/L (ref 3.5–5.1)
Sodium: 141 mmol/L (ref 135–145)
Total Bilirubin: 0.4 mg/dL (ref 0.3–1.2)
Total Protein: 6.6 g/dL (ref 6.5–8.1)

## 2018-09-21 MED ORDER — RAMIPRIL 2.5 MG PO CAPS
2.5000 mg | ORAL_CAPSULE | Freq: Every day | ORAL | Status: DC
  Administered 2018-09-22: 2.5 mg via ORAL
  Filled 2018-09-21 (×2): qty 1

## 2018-09-21 MED ORDER — ACETAMINOPHEN 325 MG PO TABS
650.0000 mg | ORAL_TABLET | Freq: Four times a day (QID) | ORAL | Status: DC | PRN
  Administered 2018-09-24: 650 mg via ORAL
  Filled 2018-09-21 (×2): qty 2

## 2018-09-21 MED ORDER — SODIUM CHLORIDE 0.9 % IV BOLUS
1000.0000 mL | Freq: Once | INTRAVENOUS | Status: AC
  Administered 2018-09-21: 1000 mL via INTRAVENOUS

## 2018-09-21 NOTE — ED Notes (Signed)
ED Provider at bedside. 
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
## 2018-09-21 NOTE — ED Notes (Signed)
Lawrence Hicks @ [phone] pts sister call when being discharged
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
## 2018-09-22 ENCOUNTER — Other Ambulatory Visit: Payer: Self-pay

## 2018-09-22 DIAGNOSIS — R413 Other amnesia: Secondary | ICD-10-CM | POA: Diagnosis not present

## 2018-09-22 DIAGNOSIS — R410 Disorientation, unspecified: Secondary | ICD-10-CM | POA: Diagnosis not present

## 2018-09-22 DIAGNOSIS — R456 Violent behavior: Secondary | ICD-10-CM | POA: Diagnosis not present

## 2018-09-22 DIAGNOSIS — R4689 Other symptoms and signs involving appearance and behavior: Secondary | ICD-10-CM

## 2018-09-22 LAB — URINALYSIS, ROUTINE W REFLEX MICROSCOPIC
Bilirubin Urine: NEGATIVE
Glucose, UA: NEGATIVE mg/dL
Hgb urine dipstick: NEGATIVE
Ketones, ur: NEGATIVE mg/dL
Leukocytes,Ua: NEGATIVE
Nitrite: NEGATIVE
Protein, ur: NEGATIVE mg/dL
Specific Gravity, Urine: 1.011 (ref 1.005–1.030)
pH: 7 (ref 5.0–8.0)

## 2018-09-22 LAB — SARS CORONAVIRUS 2 BY RT PCR (HOSPITAL ORDER, PERFORMED IN ~~LOC~~ HOSPITAL LAB): SARS Coronavirus 2: NEGATIVE

## 2018-09-22 MED ORDER — AMLODIPINE BESYLATE 5 MG PO TABS
5.0000 mg | ORAL_TABLET | Freq: Every day | ORAL | Status: DC
  Administered 2018-09-27 – 2018-10-08 (×8): 5 mg via ORAL
  Filled 2018-09-22 (×15): qty 1

## 2018-09-22 MED ORDER — AMLODIPINE BESYLATE 5 MG PO TABS: 10.0000 mg | ORAL_TABLET | Freq: Every day | ORAL | Status: DC
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
## 2018-09-22 NOTE — ED Notes (Signed)
Pt is incontinent of urine; soiled all linen and himself. Pt cleaned and condom cath re-applied to try to obtain urine specimen.

## 2018-09-22 NOTE — Progress Notes (Signed)
CSW met with patient to discuss ALF placement, consent to speak with brother, and openness to stay with family. CSW noted patient affirmed that they have a different father, it is not his brother, and he has no family at all. CSW noted patient refused consent to communicate with brother but has not revoked consent with his niece at this time.  CSW noted patient wanted to return home as [employer] is, too his report, bringing his ID and belongings and if he is not home they will discard them. CSW noted patient's concern regarding his cat being left alone.  CSW spoke with niece and asked if the family that was in [place] would be able to swing by and feed the cat. CSW noted she will speak with them and if able they will likely feed the cat. CSW notes this may need to be followed up with animal control or [employer].  Lamonte Richer, LCSW, [place] Worker II [phone]

## 2018-09-22 NOTE — Care Management (Addendum)
ED CM reviewed record and spoke with [facility] RN on purple,  BH assessment  recommending ALF placement, Patient declines states, he wants to go home. Patient lives alone and would benefit from [facility].ED CSW contacted to follow up.

## 2018-09-22 NOTE — Progress Notes (Signed)
Received Lawrence Hicks in his room asleep with the sitter at the bedside. No noted distress. He woke up later and wanted to go home. He was disorganized and did not believed it was late at night. He demanded his light to stay on in his room for several hours before he calmed down and drifted off to sleep.

## 2018-09-22 NOTE — ED Notes (Signed)
Pt ate and is now lying on bed resting on bed. Condom Cath noted to be intact - draining yellow urine.

## 2018-09-22 NOTE — ED Notes (Signed)
Joey, SW, in w/pt.

## 2018-09-22 NOTE — ED Notes (Signed)
1st Exam completed for IVC papers - copy faxed to [facility], copy sent to Medical Records, original placed in folder for Magistrate, and all 3 sets on clipboard. Robby, SW, [facility] aware copy was faxed.

## 2018-09-22 NOTE — ED Notes (Signed)
Lunch tray ordered 

## 2018-09-22 NOTE — ED Notes (Signed)
Breakfast ordered 
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
## 2018-09-22 NOTE — ED Provider Notes (Signed)
Pt sleeping this am.  Pt has IVC paperwork.  Vitals stable    Lawrence Hicks, [place] 09/22/18 [id number]    Varney Biles, MD 09/23/18 [phone]

## 2018-09-22 NOTE — ED Notes (Signed)
Condom cath intact to pt.

## 2018-09-22 NOTE — Progress Notes (Signed)
Received Lawrence Hicks from the main ED alert but  Immediately went to sleep. IVC paperwork faxed to [phone] one copy to medical records and one copy the magistrate. He continued to sleep throughout the night without incident.

## 2018-09-22 NOTE — ED Notes (Signed)
Pt gave verbal permission to speak w/his niece - Malachy Mood [place] - [phone] - who called advising her parents had notified her of pt's condition and condition of home. Advised her she may speak w/Joey, SW, and transferred call to him.

## 2018-09-22 NOTE — Progress Notes (Signed)
CSW met with patient to discuss ALF placement and supports in his community and living situation. CSW noted patient stated "What do you mean? I have to be home by May 12 or they will take everything I own." CSW noted patient stated he saw this on a program on T.V. earlier today (T.V. had not been on all day).   CSW noted when discussing the recommendation for additional living supports in ALF patient stated he needed to get home right now to feed his cat who has gone without food for the past two days. Patient's demeanor started to elevate and voice presented as agitated/frustrated. CSW noted patient agreed for CSW to come back in an hour to continue discussion of placement.  Lamonte Richer, LCSW, [place] Worker II [phone]

## 2018-09-22 NOTE — ED Notes (Signed)
Joey, SW, spoke w/pt re: ALF placement.
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
## 2018-09-22 NOTE — ED Provider Notes (Signed)
TTS consult appreciated.  Patient is to be held overnight for evaluation by psychiatry in the morning.  Urinalysis is still pending.   Delora Fuel, MD 58/48/35 [phone]

## 2018-09-23 NOTE — ED Notes (Signed)
Pt resting comfortable in bed. Pt irritable. Uncooperative, redirected. Confused. Rambling.  Delusional.
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
## 2018-09-23 NOTE — Progress Notes (Signed)
CSW called pt's niece [place], [place] at ph:  CSW received a call from [employer] at the [employer] who stated [facility] who has a community in [place], [place] and other options for respite care that can transition the pt into permanent long-term residential ALF placement using either Medicaid or cash.  Jackelyn Poling said to call her at ph: [phone] when more is known about whether the family can private pay or not and to give her a call back.  CSW awaiting return call from pt's daughter [place], [place] at [phone].  CSW will continue to follow for D/C needs.  Alphonse Guild. Samanthan Dugo, LCSW, LCAS, CSI Transitions of Care Clinical Social Worker Care Coordination Department Ph: [phone]

## 2018-09-23 NOTE — Progress Notes (Addendum)
2nd shift ED CSW received a handoff from the 1st shift [facility] ED CSW.   CSW was provided a list of ALF's and their advertised cost for patient who are paying privately (with cash).  Pt would be appropriate for an ALF stay per the handoff and although pt's family is currently working towards gaining guardianship for the pt, and the pt's family's attorney is the HCPOA (named Forensic psychologist of [employer] in [place]), per the chart, the pt is currently refusing placement, per the notes.  Per 1st shift ED CSW:   [facility]- $3200 a month for private room, [phone] a month for a companion room  CSW will continue to follow for D/C needs.  Alphonse Guild. Minnette Merida, LCSW, LCAS, CSI Transitions of Care Clinical Social Worker Care Coordination Department Ph: [phone]
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
## 2018-09-23 NOTE — Progress Notes (Addendum)
CSW received a call from pt's niece at ph: [place], Domenica at ph: [phone] who also lives in [place]. and who was requesting an update.    CSW explained pt's family needs to formulate a D/C plan for the pt as the pt cannot remain in the ED for the pt's guardianship process (assistance in the home, ALF, respite care) and reminded pt's niece that if pt recovers from his AMS sufficiently and presents as A&OX$ and demands to leave the [facility] ED has no legal right to keep pt in the ED against his wheel.  Pt's niece stated, "I don't think that will happen (pt recovers from AMS).  CSW again emphasized pt cannot remain in the ED and pt's niece stated she lives in [place] like the pt's brother and SIL do, approximately an hour from each other in [place].  Pt niece stated she will call for updates.    CSW will continue to follow for D/C needs.  Alphonse Guild. Armistead Sult, LCSW, LCAS, CSI Transitions of Care Clinical Social Worker Care Coordination Department Ph: [phone]      '

## 2018-09-23 NOTE — Progress Notes (Signed)
CSW updated EDP and will call family.  CSW will continue to follow for D/C needs.  Alphonse Guild. Geraldy Akridge, LCSW, LCAS, CSI Transitions of Care Clinical Social Worker Care Coordination Department Ph: [phone]

## 2018-09-23 NOTE — ED Notes (Signed)
HCPOA was faxed to unit and placed in chart

## 2018-09-23 NOTE — Progress Notes (Signed)
CSW called about Respite Care options at [facility] and left HIPPA-compliant VM's with:  1. Jessica at ph: [phone] and  2. Alfonso Ellis at ph: [phone] Medical illustrator with [facility]).  CSW will continue to follow for D/C needs.  Alphonse Guild. Toddy Boyd, LCSW, LCAS, CSI Transitions of Care Clinical Social Worker Care Coordination Department Ph: [phone]

## 2018-09-23 NOTE — ED Notes (Signed)
Requested help using the bathroom interaction during this time as well as his behavior was appropriate and cooperative. He was given a cup of water and he drank several sips, thanked Probation officer, declined offer of another blanket, pleasant and appropriate. Will continue to monitor for safety.
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
## 2018-09-23 NOTE — ED Notes (Signed)
[facility] contacted about pt's breakfast tray.

## 2018-09-23 NOTE — ED Notes (Signed)
Lunch has been ordered  

## 2018-09-24 NOTE — ED Notes (Signed)
Breakfast tray here.

## 2018-09-24 NOTE — ED Notes (Signed)
Patient c/o headache, requesting tylenol for pain.

## 2018-09-24 NOTE — Progress Notes (Signed)
CSW received call from Victorino Dike [phone]), who is patient's HCPOA and attorney for an update. CSW informed Guy Begin that patient is currently oriented x4 and is going to be evaluated to determine his capacity. CSW will place call to [facility] whenever additional information is available.  Madilyn Fireman, MSW, [place] Clinical Social Worker Emergency Department [employer] [phone]

## 2018-09-24 NOTE — ED Notes (Signed)
Dinner tray ordered.

## 2018-09-24 NOTE — ED Notes (Signed)
LCSW Arbie Cookey spoke with this RN about patient's mental status at this time. This RN advised Arbie Cookey that patient is calm, cooperative and a/ox4 at this time. Patient able to answer all questions appropriately. Arbie Cookey advised she has requested EDP to come evaluate patient further to determine what next steps in his care need to be.

## 2018-09-24 NOTE — ED Notes (Signed)
Dinner tray at bedside

## 2018-09-24 NOTE — ED Notes (Signed)
Lunch tray ordered 

## 2018-09-24 NOTE — ED Notes (Signed)
Patient asking how he got to the hospital. This RN explained that his family was concerned for his and their safety and sent him to the hospital to be evaluated. Patient states "I don't have any family, I only have cats." patient then told me where he lived currently as well as previous places he has lived along with information about his past career but still insisted that he had no family living. Patient calm and cooperative.

## 2018-09-24 NOTE — ED Notes (Signed)
Incontinent this am, adult diaper and chux were soaking wet and smelled strongly of urine. He allowed himself to be changed but unable to help in the process.  Sitter at bedside and will continue to monitor for safety until a placement for him can be found.

## 2018-09-24 NOTE — ED Notes (Signed)
Lunch tray here

## 2018-09-24 NOTE — Progress Notes (Signed)
CSW received call from Domenica Azerbaijan, patient's niece wanting an update regarding plan. CSW and the patient's niece discussed the possibility of paying out of pocket for patient to go to a facility and patient's niece stated that there are no financial resources available from herself or her parents Christy Sartorius and Mayer Masker). Niece states that her parents are pursuing guardianship of the patient and CSW reiterated that the hospital cannot keep the patient until guardianship is obtained past his IVC. Niece is adamant that the patient will not recover and will not become alert and oriented ever again. CSW encouraged niece to remain optimistic that the patient could make positive progress.  CSW spoke with patient's RN Vikki Ports to obtain update, RN reports that patient has been pleasant and cooperative and seems to be regaining orientation.  The patient's IVC expires 09/28/2018 at 4:55pm.  Madilyn Fireman, MSW, [place] Worker Emergency Department [facility] [phone]

## 2018-09-24 NOTE — ED Notes (Signed)
Breakfast tray ordered 

## 2018-09-24 NOTE — ED Notes (Signed)
Pt given snack and water

## 2018-09-24 NOTE — Progress Notes (Signed)
CSW spoke with EDP Merry Proud to discuss the plan for this patient. CSW inquired with EDP about the possibility of consulting with psychiatry to make a ruling on the patient's capacity. CSW provided EDP with a brief summary of patient and the reasons for him being IVC'ed. EDP was agreeable to go assess patient and will contact CSW with findings.  Madilyn Fireman, MSW, [place] Clinical Social Worker Emergency Department [employer] [phone]

## 2018-09-25 DIAGNOSIS — R413 Other amnesia: Secondary | ICD-10-CM | POA: Diagnosis not present

## 2018-09-25 NOTE — ED Notes (Signed)
Social work at bedside for evaluation

## 2018-09-25 NOTE — Progress Notes (Signed)
CSW spoke with Merry Proud, [place] who is caring for this patient today to discuss a plan for a potential discharge. CSW and PA discussed the patient's presentation in regards to his memory and him being completely oriented. CSW informed PA that though the patient was speaking in a bizarre way during the face to face interaction on 08/24/18 he was able to tell CSW who he was, where he was, and why he was here. PA stated he would place orders for psych to evaluate patient to determine his capacity. CSW and PA discussed the possibility of discharging the patient home with [facility] services with an aide to ensure he has assistance completing his ADL's.  CSW to continue following until safe discharge is complete.  Madilyn Fireman, MSW, [place] Clinical Social Worker Emergency Department [employer] [phone]

## 2018-09-25 NOTE — Progress Notes (Signed)
Physical Therapy Treatment Note  Limited PT evaluation completed and full note to follow   Discussed Lawrence Hicks case with Lawrence Hicks;   Today Lawrence Hicks declined mobility/walking with me;   Lawrence Hicks does tell me Lawrence Hicks typically uses a cane, and that Lawrence Hicks does not like Rolling Walkers;   Given Lawrence Hicks must be independent to be home, and Lawrence Hicks has been in bed for quite some time, it stands to reason that Lawrence Hicks may be weaker, with less activity tolerance, and potentially a fall risk;   Worth considering post-acute rehabilitation to maximize independence and safety with mobility prior to dc home;  It is likely, however, that Lawrence Hicks will refuse SNF for rehab, and in that case (if Lawrence Hicks has capacity), will recommend maximizing HH services (PT,OT, Aide, RN, SW)  I also noted that Lawrence Hicks is active with [facility], and I strongly recommend looking into the [facility] as an option for 24 hour assist, and a focus on rehab within the home,  initially upon return home.    Will follow -- I'm hopeful that Lawrence Hicks will be more agreeable to mobility/walking/ADLs next session.    Roney Marion, [place]  Acute Rehabilitation Services Pager [phone] Office [phone]

## 2018-09-25 NOTE — ED Notes (Signed)
Breakfast ordered 
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
## 2018-09-25 NOTE — ED Notes (Signed)
Ordered pt's regular dinner tray.

## 2018-09-25 NOTE — ED Notes (Signed)
Pt refises tp tale bp medicine offered

## 2018-09-25 NOTE — ED Provider Notes (Signed)
85 year old male here under IVC.  Upon chart review it appears that patient's family has come in to town from [place].  Per chart review this is the patient's brother.  They noted that they would like to take the patient home with him to [place], he refused.  Thereafter they reported that he was altered and brought him to the emergency room for IVC.  I talked to the patient at bedside he is alert and oriented, he does not know where he is at but he recalls coming to the hospital he cannot recall the name because he was uncertain why he was coming to the facility.  He knows the month and year, he knows his address.  He is speaking in clear full sentences.  When asked about family in town, he reports that the" so-called brother" is not the brother that he knew but admits that he is not seeing him in a very long time.  He has never seen the sister-in-law.  Patient reports that he would like to go home, he would not like to go with his reported family.  I had discussion with social work, given his age and reports of dementia and altered mental status I do feel that input from psychiatry would be beneficial for capacity.  We will place a psychiatry consult to help determine patient's ability to safely be discharged.  After speaking with psychiatrist we will need additional information to help determine capacity and level of care needed.  I will place order for physical therapy and Occupational Therapy evaluation to help assess.  Physical therapy did evaluate the patient patient unable to ambulate, question of this is due to physical weakness or secondary to compliance.  They did mention that patient would likely qualify for 24-hour assistance at home with focus on rehab through the [place] home first program.  It seems that this would likely be the most reasonable option given the patient's preference.    Physical therapy will attempt to reevaluate tomorrow morning at that time disposition will likely be  obtainable.   Okey Regal, PA-C 09/25/18 1251    Maudie Flakes, MD 09/25/18 [phone]

## 2018-09-25 NOTE — Progress Notes (Addendum)
CSW received a call from pt's sister-in-law and husband at ph: [phone] who originally brought pt into the ED and were requesting an update.  Pt's sister-in-law and husband have returned to their home in [place] and were requesting information on guardianship as they have an attorney in [place] who is working on their behalf to assist them in gaining guardianship, per the pt's SIL.  CSW will continue to follow for D/C needs.  Alphonse Guild. Allah Reason, LCSW, LCAS, CSI Transitions of Care Clinical Social Worker Care Coordination Department Ph: [phone]

## 2018-09-25 NOTE — ED Notes (Signed)
Patient is resting comfortably. 

## 2018-09-25 NOTE — Progress Notes (Signed)
CSW spoke with Victorino Dike, patient's HCPOA who is requesting an update. CSW provided [name] with update and will continue to be in contact with her to ensure a safe discharge for the patient.  Madilyn Fireman, MSW, [place] Clinical Social Worker Emergency Department [employer] [phone]

## 2018-09-25 NOTE — ED Notes (Signed)
Pt's dinner tray arrived. 
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
## 2018-09-25 NOTE — ED Notes (Signed)
The pt refused his norvasc he does not want his bp taken again either hostile

## 2018-09-26 ENCOUNTER — Ambulatory Visit: Payer: Self-pay

## 2018-09-26 DIAGNOSIS — R4182 Altered mental status, unspecified: Secondary | ICD-10-CM | POA: Diagnosis not present

## 2018-09-26 DIAGNOSIS — R413 Other amnesia: Secondary | ICD-10-CM | POA: Diagnosis not present

## 2018-09-26 DIAGNOSIS — R456 Violent behavior: Secondary | ICD-10-CM | POA: Diagnosis not present

## 2018-09-26 DIAGNOSIS — Z0489 Encounter for examination and observation for other specified reasons: Secondary | ICD-10-CM

## 2018-09-26 DIAGNOSIS — Z008 Encounter for other general examination: Secondary | ICD-10-CM | POA: Diagnosis not present

## 2018-09-26 NOTE — ED Provider Notes (Signed)
Emergency Medicine Observation Re-evaluation Note  Lawrence Hicks is a 85 y.o. male, seen on rounds today.  Pt initially presented to the ED for complaints of Aggressive Behavior Currently, the patient is sleeping. Overnight Lawrence Hicks was refusing his home medications as well as having his vitals taken.   Physical Exam  BP (!) 155/87 (BP Location: Right Arm)   Pulse 71   Temp 97.8 F (36.6 C) (Oral)   Resp 18   Ht 5\' 6"  (1.676 m)   Wt 61.2 kg   SpO2 95%   BMI 21.79 kg/m    Physical Exam Constitutional:      Comments: Sleeping comfortably in bed; respirations even and unlabored.   Neurological:     Mental Status: Lawrence Hicks is alert.     ED Course / MDM  XHB:ZJIR   I have reviewed the labs performed to date as well as medications administered while in observation.  No recent changes in the last 24 hours to labs or medications.   Plan  Current plan is for PT/OT to assess patient prior to psych evaluating patient for capacity prior to possible dispo home. PT attempted to assess patient yesterday but Lawrence Hicks declined participation at that time.  Patient is under full IVC at this time.   Eustaquio Maize, PA-C 09/26/18 1629    Gareth Morgan, MD 09/27/18 1006

## 2018-09-26 NOTE — ED Notes (Signed)
Patient refused a Snack and drink.

## 2018-09-26 NOTE — Progress Notes (Signed)
CSW spoke with Dr. Mariea Clonts who completed evaluation for patient to determine his capacity for refusing a SNF placement. Dr. Mariea Clonts informed CSW that the patient is disoriented and confused, which deems him to not have capacity to make his own healthcare decisions.  CSW spoke with patient's HCPOA Victorino Dike [phone]) to inform her of decision that was made regarding the patient's lack of capacity. [name] provided consent for CSW to begin the process of securing a SNF bed. [name] requested the placement be in [facility].  CSW will begin placement process.  Madilyn Fireman, MSW, [place] Clinical Social Worker Emergency Department [employer] [phone]

## 2018-09-26 NOTE — ED Notes (Signed)
Patient refused vitals from several staff members including RN; pt advised that we will have to get vitals in the AM due to hx of HTN and refusal of PO medications; Patient is very agitated with staff and ask staff to leave his room-Monique,RN

## 2018-09-26 NOTE — ED Notes (Signed)
Complained to nurse tech he was having a pulling sensation in his right quad. Denies it being painful. He thinks it is from a lack of movement. Gave him a cup of milk for the calcium and put a heat pack on it to help relax his muscle. He is alert and cooperative and appropriate at this time. Is concerned with who is paying for all the care he is getting here. Will continue to monitor for safety.

## 2018-09-26 NOTE — ED Notes (Signed)
Sitter attempted to get pt to eat breakfast inititally did not respond then with prompting told sitter and this RN "I want nothing!"

## 2018-09-26 NOTE — ED Notes (Signed)
He is requesting physical therapy he states he is getting to weak.
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
## 2018-09-26 NOTE — Progress Notes (Addendum)
1:45pm: CSW went to [facility] to speak with patient's RN Emmy. Lowella Petties, RN reports that PT just evaluated patient.  CSW spoke Dr. Mariea Clonts, psychiatrist to discuss the recent recommendation for SNF from PT. Dr. Mariea Clonts to call unit and assess patient via video call.   1:30pm: CSW received call from patient's niece Domenica for an update on patient. CSW provided niece with appropriate update regarding patient's pending discharge plan.   1pm: CSW has been in contact with patient's RN Lowella Petties and PT Beth regarding a safe discharge plan for this patient.  PT will assess patient as soon as her schedule allows.  CSW will continue following until discharge is completed.  Madilyn Fireman, MSW, [place] Clinical Social Worker Emergency Department [employer] [phone]

## 2018-09-26 NOTE — ED Notes (Signed)
Woke up and disoriented and uncooperative, insisting staff was in his house and he didn't want [place] there, demanding we leave. Got self out of his bed and standing at the foot of the bed. Sitter present and with Probation officer getting him a chair to sit in, sat after much arguing for staff to get out of his house. Stating there are all of his things, his phone, his chair, etc. Techs able to change his urine soaked adult diaper, gave him a drink and crackers to eat and turned his bedroom light and the unit lights up believing that would help him orient. Seems to have helped, much calmer. No longer arguing and allowing staff to help him.

## 2018-09-26 NOTE — ED Notes (Signed)
Regular Diet was ordered for Lunch. 
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
## 2018-09-26 NOTE — ED Notes (Signed)
Dr. Norman at bedside.  

## 2018-09-26 NOTE — ED Notes (Signed)
Per social work, Dr Mariea Clonts of psychiatry at [facility] is going to assess pt to determine whether he is mentally capable of deciding whether to go to a nursing home. Discussed this with charge and Dr Laverta Baltimore both are agreeable to this plan
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
## 2018-09-27 DIAGNOSIS — R413 Other amnesia: Secondary | ICD-10-CM | POA: Diagnosis not present

## 2018-09-27 NOTE — ED Notes (Signed)
Area of pink blanchable skin noted on sacrum barrier cream applied to site with brief change

## 2018-09-27 NOTE — Progress Notes (Signed)
2nd shift ED CSW received a handoff from the 1st shift [facility] ED CSW.   Per 1st shift ED CSW, pt has one SNF bed offer.    CSW received a call from pt's RN stating animal control was given the CSW's # in order to facilitate taking care in some fashion of the pt's cat which has been at or in the pt's home  since the pt has been in the ED.  Per pt's RN, animal control was inquiring about pt's keys to the house and/or permission to enter the home to care for the pt's cat.  CSW will continue to follow for D/C needs.  Lawrence Hicks. Kaleem Sartwell, LCSW, LCAS, [facility] Transitions of Care Clinical Social Worker Care Coordination Department Ph: [phone]

## 2018-09-27 NOTE — Progress Notes (Signed)
CSW went to unit to check on patient's status and speak with Shauna Hugh, Therapist, sports. Diane reports that patient has been pleasant throughout morning and that he fed himself breakfast. CSW asked Diane to assist with contacting EDP for an H&P note to be entered into the chart to be sent to SNF facilities, RN agreeable.  CSW completed fax out process for SNF placement. Currently awaiting bed offers and will present them to [facility] for a decision.  Madilyn Fireman, MSW, [place] Clinical Social Worker Emergency Department [employer] [phone]

## 2018-09-27 NOTE — ED Notes (Signed)
Pt bath give after incontinence of urine.  Bed changed. Warm blanket provided. Breakfast provided. Pt fed himself. Safety sitter present. Pt not oriented to time or place.  Pleasant at this time.

## 2018-09-27 NOTE — ED Notes (Signed)
Woke patient to check if his adult diaper was wet. His diaper was changed, repositioned, and vitals taken. He was sleepy and followed direction but did not speak or resist. Sitter present.
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
## 2018-09-27 NOTE — ED Notes (Signed)
Changed pt brief.

## 2018-09-27 NOTE — Discharge Planning (Signed)
Clinical Social Work is seeking post-discharge placement for this patient at the following level of care: SNF.    

## 2018-09-27 NOTE — ED Notes (Signed)
Pt became verbally upset, was able to be verbally redirected.  "I need to go home, you are keeping me against my will."  Only lasted 5 minutes, pt back to sleep.

## 2018-09-27 NOTE — ED Provider Notes (Signed)
Emergency Medicine Observation Re-evaluation Note  Lawrence Hicks is a 85 y.o. male, seen on rounds today.  Pt initially presented to the ED for complaints of Aggressive Behavior Currently, the patient is sleeping.  Physical Exam  BP (!) 159/72 (BP Location: Right Arm)   Pulse 61   Temp 98.2 F (36.8 C) (Oral)   Resp 16   Ht 5\' 6"  (1.676 m)   Wt 61.2 kg   SpO2 97%   BMI 21.79 kg/m  Physical Exam Sleeping, respirations even and unlabored. ED Course / MDM  MMI:TVIF   I have reviewed the labs performed to date as well as medications administered while in observation.  Recent changes in the last 24 hours include nighttime agitation, wears depend for urinary incontinence with frequent changing/cleaning by nursing staff.  Patient was evaluated by Dr. Mariea Clonts with psychiatry patient does not demonstrate capacity to refuse skilled nursing facility placement, does not meet criteria for psych inpatient admission.  Current plan is for caseworker to work on skilled nursing facility placement.  Patient was evaluated by physical therapy who recommends minimum 3 times per week PT and OT evaluation. Plan  Current plan is for SNF placement. Patient is under full IVC at this time.   Tacy Learn, PA-C 09/27/18 0920    Valarie Merino, MD 09/27/18 [phone]

## 2018-09-27 NOTE — Progress Notes (Signed)
CSW received call from [facility] [place] regarding the patient's cat. CSW provided [employer] with the patient's [employer] contact information for further discussion.  Madilyn Fireman, MSW, [place] Clinical Social Worker Emergency Department [employer] [phone]

## 2018-09-28 DIAGNOSIS — R413 Other amnesia: Secondary | ICD-10-CM | POA: Diagnosis not present

## 2018-09-28 LAB — SARS CORONAVIRUS 2 BY RT PCR (HOSPITAL ORDER, PERFORMED IN ~~LOC~~ HOSPITAL LAB): SARS Coronavirus 2: NEGATIVE

## 2018-09-28 NOTE — ED Notes (Signed)
Pt's niece called - aware waiting for SNF. Transferred her call to SW.

## 2018-09-28 NOTE — Progress Notes (Signed)
Patient has been offered a bed at [facility]. CSW notes patient's HCPOA must be consulted for approval for admission due to patient not being capable to make health care decisions. CSW will attempt to reach HCPOA.  Lamonte Richer, LCSW, [place] Worker II [phone]

## 2018-09-28 NOTE — ED Notes (Signed)
Lunch tray ordered 

## 2018-09-28 NOTE — ED Notes (Signed)
When rounding assessment, pt says "did you call donald trump? Where is all my stuff, did you hide it from me? All my family members are dead, who is going to pay for all of this stuff, my medicaid?" Pt oriented to self only at this time. Dimmed lights for comfort, pt says "I have not slept in 3 days". Comfort measures, will continue to assess.

## 2018-09-28 NOTE — ED Notes (Signed)
Dinner tray ordered.

## 2018-09-28 NOTE — ED Provider Notes (Signed)
  Physical Exam  BP (!) 172/79 (BP Location: Left Arm)   Pulse 69   Temp (!) 97.4 F (36.3 C) (Oral)   Resp 19   Ht 5\' 6"  (1.676 m)   Wt 61.2 kg   SpO2 95%   BMI 21.79 kg/m   Physical Exam  ED Course/Procedures     Procedures  MDM  No events overnight. Social work is seeking post-discharge placement for this patient at the following level of care: SNF.         Alveria Apley, PA-C 09/28/18 9470    Gareth Morgan, MD 09/28/18 [phone]

## 2018-09-28 NOTE — Progress Notes (Addendum)
11:00AM: CSW informed [facility] declined.   10:15AM: CSW spoke with Juliann Pulse at [facility]. CSW provided her with information on patient's condition and discussed bed offer. CSW noted they will reach out to [facility] regarding a waiver and will provide CSW an update.  9:30AM: CSW left voicemail with Santiago Glad at [facility] to discuss availability of bed this weekend.  CSW spoke with HCPOA and noted they were open to [facility] and stated for hospital to proceed.  Lamonte Richer, LCSW, [place] Worker II [phone]

## 2018-09-28 NOTE — ED Notes (Addendum)
Pt refusing to take med. Stating "I don't want any. I already took it twice this morning. My blood pressure is OK". Pt initially refusing COVID-19 test to be performed. RN was able to obtain after much encouragement.

## 2018-09-28 NOTE — Progress Notes (Signed)
CSW informed HCPOA of [facility] declining patient after requesting additional information. CSW noted patient returning home would not be a safe discharge and HCPOA had concerns of patient leaving AMA. CSW noted HCPOA stated she would not approve patient going home.  Lamonte Richer, LCSW, [place] Worker II [phone]

## 2018-09-28 NOTE — ED Notes (Signed)
Breakfast delivered  

## 2018-09-29 DIAGNOSIS — R413 Other amnesia: Secondary | ICD-10-CM | POA: Diagnosis not present

## 2018-09-29 NOTE — ED Notes (Signed)
Breakfast tray ordered for pt

## 2018-09-29 NOTE — ED Notes (Signed)
Pt resting quietly on bed. Dinner tray delivered - states he does not want to eat at this time.

## 2018-09-29 NOTE — ED Notes (Signed)
Pt took Norvasc w/apple sauce. Warm blanket given as requested. Pt feeding self rest of apple sauce.

## 2018-09-29 NOTE — ED Notes (Signed)
Pt being bathed by Sitter/NT.

## 2018-09-29 NOTE — Progress Notes (Addendum)
1:45:PM: CSW faxed referrals to [facility] of [place]. CSW unable to find contact information for [facility] [place]. CSW attempted to reach facilities admission via phone and was unable to reach.  CSW staffed patient's case with [facility] assistant director who recommended reaching out to to [facility] [place], [place].   CSW received call from Caprice Kluver [phone], a friend of the patient that has been assisting patient in maintaining home, taking care of cat, getting groceries and other neccessary supports. CSW noted they were continuing to care for patient's cat. CSW noted they were checking in on patient every other day and noted patient began to hear voices and see auditory hallucinations about 2-3 months ago. Per [place]'s report patient has been fixated on winning the lottery for the last month or so but no evidence of this. CSW noted they reported they wanted to continue to be supportive of the patient.  CSW spoke with [facility] Victorino Dike regarding this individual and their report. CSW noted HCPOA did not want patient to be discharged with this care giver as patient was recommended SNF/ALF placement. CSW provided HCPOA the individual's phone number so they can follow up. CSW will continue to follow for discharge support.  Lamonte Richer, LCSW, [place] Worker II [phone]

## 2018-09-29 NOTE — ED Notes (Signed)
Pt's breakfast tray arrived 

## 2018-09-29 NOTE — ED Provider Notes (Signed)
  Physical Exam  BP (!) 148/58 (BP Location: Right Arm)   Pulse (!) 59   Temp 97.9 F (36.6 C) (Axillary)   Resp 16   Ht 5\' 6"  (1.676 m)   Wt 61.2 kg   SpO2 97%   BMI 21.79 kg/m   Physical Exam  ED Course/Procedures     Procedures  MDM  Patient continues to wait for SNF placement. Unable to go home due to dementia and not being ale to care for himself. No events overnight     Kristine Royal 09/29/18 8209    Davonna Belling, MD 09/29/18 [phone]

## 2018-09-29 NOTE — ED Notes (Signed)
Patient denies pain and is resting comfortably.  

## 2018-09-30 DIAGNOSIS — R413 Other amnesia: Secondary | ICD-10-CM | POA: Diagnosis not present

## 2018-09-30 NOTE — Progress Notes (Signed)
CSW reached out to [place] of admissions at [place] to discuss this patient, there was no answer so a voicemail was left requesting return call.  Madilyn Fireman, MSW, [place] Clinical Social Worker Emergency Department [employer] [phone]

## 2018-09-30 NOTE — ED Notes (Signed)
Pt's lunch tray ordered.  
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
## 2018-09-30 NOTE — ED Notes (Signed)
PT refused Tech and RN on getting vitals.

## 2018-09-30 NOTE — ED Notes (Addendum)
Physical therapy here to see pt. Pt's cane gathered from locker to try and ambulate with. Pt is still aggravated about being here and about not having his belongings. Pt is refusing to do physical therapy and asking this Rn to call the police. Pt stated "unless you come in here with a way to get me home then you can leave you jackass" Pt raised his legs back up in bed and laid back down. This RN offered to cut the light off in the room and the pt wanted the light, then this RN turned the light off and the pt yelled "cut that back on ya jackass I can't see anything" Light on. Pt in bed resting. Decreasing stimuli.

## 2018-09-30 NOTE — Care Management (Addendum)
Patient is still awaiting safe disposition plan. CSW has faxed referral to several SNFs no bed offers as of yet. CSW will follow up in the am.

## 2018-09-30 NOTE — ED Notes (Signed)
PT called and stated that they would be in to see pt early afternoon. They requested a recliner to be place in his room. This RN to do so. PT notified that pt does not like rolling walker and pt state that he does better with this quad cain with suction. PT already aware.

## 2018-09-30 NOTE — Progress Notes (Signed)
CSW received phone call from patient's HCPOA Victorino Dike requesting an update on patient. CSW informed HCPOA that patient had been faxed out to several facilities with some declinations but also some that were still under review. Guy Begin was concerned after conversations that she had this weekend with weekend CSW stating that patient was doing to be discharged home without a safe plan. CSW reiterated to [facility] that that would not occur and that the patient would have a safe discharge plan. HCPOA recently obtained documentation from the patient's financial institutions for review for potentially applying for Medicaid.  CSW to continue following patient until a safe discharge is completed.  Madilyn Fireman, MSW, [place] Clinical Social Worker Emergency Department [employer] [phone]

## 2018-09-30 NOTE — ED Notes (Signed)
Pt's breakfast tray arrived 

## 2018-09-30 NOTE — ED Provider Notes (Signed)
  Physical Exam  BP (!) 154/73 (BP Location: Left Arm)   Pulse 61   Temp 97.6 F (36.4 C) (Oral)   Resp 18   Ht 5\' 6"  (1.676 m)   Wt 61.2 kg   SpO2 95%   BMI 21.79 kg/m   Physical Exam   Patient sleeping. No evidence of acute distress.  Even respirations.   ED Course/Procedures     Procedures  MDM  Patient waiting placement.  He is resting comfortably in bed.  No events overnight.       Volanda Napoleon, PA-C 09/30/18 2341    Carmin Muskrat, MD 09/30/18 [phone]

## 2018-09-30 NOTE — ED Notes (Signed)
Meal tray delivered to room. Pt eating.

## 2018-09-30 NOTE — ED Notes (Signed)
Pt's meal tray arrived. Pt is now aggravated and asking for this RN to call the police because the pt states he has been here 8 weeks with no therapy. Pt educated that PT is coming to see pt around 1300. Pt continued to yell at this RN. De-escalation tactics used by this RN and pt appears slightly more calm now.

## 2018-09-30 NOTE — Progress Notes (Signed)
CSW aware of patient's need for SNF placement. Patient was faxed out to [facility] yesterday by weekend CSW.  CSW will continue to follow patient for safe discharge.   Madilyn Fireman, MSW, [place] Clinical Social Worker Emergency Department [employer] [phone]

## 2018-09-30 NOTE — ED Notes (Addendum)
Attempted to assess pt. And obtain pts. VS. Pt. Refused. Pt. guarding himself in bed refusing nursing staff to remove covers.  Pt states nursing staff are "imposters" and that staff doesn't know what they are doing. "Every one knows that taking the BP again will make it go up. You've already taking BP 3 times all day."   Pt uncooperative at this time. Will continue to monitor pt. Sitter at bedside.

## 2018-09-30 NOTE — ED Notes (Signed)
Breakfast tray ordered 

## 2018-10-01 ENCOUNTER — Other Ambulatory Visit: Payer: Self-pay

## 2018-10-01 DIAGNOSIS — R413 Other amnesia: Secondary | ICD-10-CM | POA: Diagnosis not present

## 2018-10-01 NOTE — Progress Notes (Addendum)
CSW spoke with patient's HCPOA Victorino Dike to discuss updates. [name] informed CSW that she was able to obtain the patient's financial records and that he has approximately $300,000 in assets to utilize for his care. CSW and Guy Begin discussed the private pay options for placement for the patient. [name] agreed to utilize the private pay option and would like CSW to check with [facility] to see if they have availability.   CSW spoke with Joellen Jersey from [facility] to provide the referral and patient's HCPOA contact information. [facility] stated she would speak with the [facility] and her director to make a decision.  CSW will wait for return call.  Madilyn Fireman, MSW, [place] Clinical Social Worker Emergency Department [employer] [phone]

## 2018-10-01 NOTE — ED Notes (Signed)
Pt given milk as requested.

## 2018-10-01 NOTE — ED Notes (Signed)
Regular Diet was ordered for Lunch. 

## 2018-10-01 NOTE — ED Provider Notes (Signed)
  Physical Exam  BP (!) 138/52 (BP Location: Right Arm)   Pulse (!) 52   Temp 97.6 F (36.4 C) (Oral)   Resp 18   Ht 5\' 6"  (1.676 m)   Wt 61.2 kg   SpO2 96%   BMI 21.79 kg/m   Physical Exam  Patient resting comfortably in bed. Respirations even, unlabored. Breakfast tray at bedside.    MDM  Patient awaiting placement. Refused vitals overnight, but morning vitals stable and reassuring. No complaints at this time.        Renita Papa, PA-C 10/01/18 0830    Carmin Muskrat, MD 10/01/18 1424
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
## 2018-10-01 NOTE — Progress Notes (Addendum)
OT Cancellation Note  Patient Details Name: Lawrence Hicks MRN: [id number] DOB: [birth year]   Cancelled Treatment:    Reason Eval/Treat Not Completed: Patient declined, no reason specified First Attempt 9:01 - Pt repeating "leave me alone" and covering his head with his blanket. Will return as schedule allows. Thank you.  Second Attempt at 11:14: Pt stating "would you just leave me alone. I am eating"  Will return as schedule allows. Thank you.  [place], OTR/L Acute Rehab Pager: [phone] Office: [phone] 10/01/2018, 9:01 AM

## 2018-10-01 NOTE — ED Notes (Signed)
Breakfast tray ordered 

## 2018-10-01 NOTE — ED Notes (Signed)
Patient denies pain and is resting comfortably.  

## 2018-10-01 NOTE — ED Notes (Signed)
Took Norvasc w/apple sauce.

## 2018-10-01 NOTE — Progress Notes (Signed)
PT Cancellation Note  Patient Details Name: Lawrence Hicks MRN: [id number] DOB: February 14, 1924   Cancelled Treatment:    Reason Eval/Treat Not Completed: Other (comment)(Refused x 2 this am.  Will check back as able. )   Denice Paradise 10/01/2018, 11:14 AM Isaiyah Feldhaus,PT Acute Rehabilitation Services Pager:  [phone]  Office:  [phone]

## 2018-10-02 DIAGNOSIS — R413 Other amnesia: Secondary | ICD-10-CM | POA: Diagnosis not present
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
## 2018-10-02 NOTE — ED Notes (Signed)
Breakfast tray ordered 

## 2018-10-02 NOTE — ED Notes (Signed)
Pt sitting in chair Alert, cooperative and calm.
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
## 2018-10-02 NOTE — Progress Notes (Addendum)
CSW called Juliann Pulse at [facility] who declined the pt's referral. CSW will continue to follow for D/C needs.  CSW called Cleon Dew with [facility] and left a VM on his secure VM requesting a call back to [phone] regarding the pt.  2nd shift ED CSW will leave handoff for 1st shift ED CSW.  Alphonse Guild. Adante Courington, LCSW, LCAS, CSI Transitions of Care Clinical Social Worker Care Coordination Department Ph: [phone]
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
## 2018-10-02 NOTE — Progress Notes (Signed)
CSW called [name] in admissions at [facility] at ph: [phone] and left a HIPPA-compliant VM message requesting a call back.  CSW will continue to follow for D/C needs.  Alphonse Guild. Mako Pelfrey, LCSW, LCAS, CSI Transitions of Care Clinical Social Worker Care Coordination Department Ph: [phone]

## 2018-10-02 NOTE — ED Provider Notes (Signed)
10/02/18 Physical Exam  BP (!) 148/54 (BP Location: Right Arm)   Pulse 60   Temp 97.6 F (36.4 C) (Oral)   Resp 12   Ht 5\' 6"  (1.676 m)   Wt 61.2 kg   SpO2 95%   BMI 21.79 kg/m   Physical Exam Patient sleeping in bed, no apparent distress noted.  ED Course/Procedures    Procedures  MDM   Patient awaiting placement. VS without significant change overnight. Resting comfortably in bed.       Leafy Kindle 10/02/18 0815    Davonna Belling, MD 10/02/18 [phone]
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
## 2018-10-02 NOTE — ED Notes (Signed)
Pt yelling out that he wants to go home, sitter at bedside trying to calm pt down.

## 2018-10-02 NOTE — Progress Notes (Signed)
CSW followed up with Jodi Mourning at [facility] to see if there are any barriers to discharge and Joellen Jersey states they are not offering the pt a bed at this time due to:  1. They had only three days of notes and there was insufficient information to assist them in making a decision (three days is what most SBF's request or desire) and :  2. [facility] felt pt is likely more appropriate for a memory care unit.  CSW will continue to follow for D/C needs.  Alphonse Guild. Other [place], LCSW, LCAS, CSI Transitions of Care Clinical Social Worker Care Coordination Department Ph: [phone]      ]

## 2018-10-02 NOTE — ED Notes (Signed)
PT/OT at bedside.

## 2018-10-02 NOTE — ED Notes (Signed)
Pt declined breakfast tray twice, wanting to continue sleeping. Will offer again later this morning.

## 2018-10-02 NOTE — ED Notes (Signed)
Dietary bringing ice cream for patient.

## 2018-10-03 ENCOUNTER — Other Ambulatory Visit: Payer: Self-pay

## 2018-10-03 NOTE — ED Notes (Signed)
Pt changed and cleaned. Assisted with feeding at 950 am

## 2018-10-03 NOTE — ED Notes (Signed)
Ordered bfast tray 

## 2018-10-03 NOTE — ED Notes (Signed)
[name] called for update on patient. Update provided. Brother requests to be contacted for updates on patient placement. [phone]

## 2018-10-03 NOTE — ED Notes (Signed)
Tray at bedside. Patient stated he would eat when he woke up.

## 2018-10-03 NOTE — ED Notes (Addendum)
Patient rounding done. Attempts to re-evaluate blood pressure. Patient refused. Explained benefits and effects of taking blood pressure medication. Patient continues to refuse medication and is refusing to have blood pressure re-evaluated at this time. Meal tray ordered.

## 2018-10-03 NOTE — Progress Notes (Addendum)
2:50pm: CSW received return call from [place] at [facility] stating that the patient cannot be accepted at that facility due to his behaviors.  1pm: CSW spoke with Bethena Roys at [facility] to discuss the referral that was sent for this patient. CSW and admissions coordinator discussed patient's current needs and both agreed that patient would be more appropriate for SNF versus memory care at this time due to him being unable to ambulate.  CSW spoke with Claiborne Billings at [facility] to discuss a second review for this patient. Claiborne Billings was agreeable and CSW faxed information to facility.   8am: CSW made additional referrals to the following memory care facilities: [facility], [facility], [facility].  Madilyn Fireman, MSW, [place] Clinical Social Worker Emergency Department [employer] [phone]
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
## 2018-10-03 NOTE — ED Notes (Signed)
Pt woke up and he is eating his lunch tray.

## 2018-10-04 DIAGNOSIS — R413 Other amnesia: Secondary | ICD-10-CM | POA: Diagnosis not present
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
## 2018-10-04 NOTE — ED Notes (Signed)
Pt declined BP meds earlier today as he stated yesterday they made his vision blurry. Denies blurry vision at this time

## 2018-10-04 NOTE — ED Provider Notes (Signed)
Emergency Medicine Observation Re-evaluation Note  Lawrence Hicks is a 85 y.o. male, seen on rounds today.  Pt initially presented to the ED for complaints of Aggressive Behavior Currently, the patient is eating breakfast and without complaints. Moves all 4 extremities spontaneously. Patient currently awaiting placement.  Physical Exam  BP (!) 132/55 (BP Location: Left Arm)   Pulse 70   Temp 97.9 F (36.6 C) (Oral)   Resp 16   Ht 5\' 6"  (1.676 m)   Wt 61.2 kg   SpO2 98%   BMI 21.79 kg/m  Physical Exam Vitals signs and nursing note reviewed.  Constitutional:      General: He is not in acute distress.    Appearance: He is not ill-appearing, toxic-appearing or diaphoretic.     Comments: Eating breakfast and without complaints.  Neurological:     Mental Status: He is alert.     Comments: Moves all 4 extremities without difficulty.  Psychiatric:        Attention and Perception: Attention normal.        Mood and Affect: Mood and affect normal.        Speech: Speech normal.        Behavior: Behavior is not agitated, aggressive, withdrawn, hyperactive or combative. Behavior is cooperative.    ED Course / MDM  [id number]   I have reviewed the labs performed to date as well as medications administered while in observation.  Recent changes in the last 24 hours include-- none Today's Vitals   10/03/18 1119 10/03/18 1501 10/03/18 2200 10/04/18 0632  BP:  133/79 (!) 150/72 (!) 132/55  Pulse:  75 71 70  Resp:  14 18 16   Temp:  97.7 F (36.5 C) 97.8 F (36.6 C) 97.9 F (36.6 C)  TempSrc:  Oral Oral Oral  SpO2:  98% 96% 98%  Weight:      Height:      PainSc: 0-No pain      Body mass index is 21.79 kg/m.    Plan  Current plan is for inpatient placement. VS stable over last 24 hours. On evaluation patient is eating breakfast without complaints. Nursing notes reviewed and patient without aggressive behavior overnight. Will continue to monitor.    Arvetta Araque A,  PA-C 10/04/18 0816    Lennice Sites, DO 10/04/18 6578

## 2018-10-04 NOTE — Progress Notes (Addendum)
CSW placed call to [employer] at [place] to make a referral for patient. Clyda Greener of admissions did not answer so a  voicemail was left requesting a return call.  CSW placed call to [facility] to make a referral for patient. CSW spoke with Elyse Hsu in admissions to discuss patient, CSW faxed over information needed and Elyse Hsu stated it will be reviewed as soon as possible.  Madilyn Fireman, MSW, [place] Clinical Social Worker Emergency Department [employer] [phone]

## 2018-10-04 NOTE — Progress Notes (Signed)
CSW received information regarding patient's newly appointed Guardian ad Litem as of 10/04/2018. The GAL is Dorise Hiss at [phone]. The court document was uploaded into the patient's chart by patient registration.  CSW received call from [facility] and that facility is not accepting any patients at this time.  Madilyn Fireman, MSW, [place] Clinical Social Worker Emergency Department [employer] [phone]

## 2018-10-05 NOTE — ED Notes (Signed)
Pt reading newspaper.

## 2018-10-05 NOTE — ED Provider Notes (Signed)
Emergency Medicine Observation Re-evaluation Note  Lawrence Hicks is a 85 y.o. male, seen on rounds today.  Pt initially presented to the ED for complaints of Aggressive Behavior Currently, the patient is awaiting placement in nursing home  Physical Exam  BP (!) 156/72 (BP Location: Left Arm)   Pulse (!) 58   Temp 97.7 F (36.5 C) (Oral)   Resp 17   Ht 5\' 6"  (1.676 m)   Wt 61.2 kg   SpO2 99%   BMI 21.79 kg/m  Physical Exam Nursing note and vitals reviewed. Constitutional: He appears well-developed and well-nourished. No distress.  HENT:  Head: Normocephalic and atraumatic. Marland Kitchen    Pulmonary/Chest: Effort normal. No respiratory distress.  Abdominal: No distension.  Musculoskeletal: He exhibits no edema.  Neurological: He is asleep Skin:  He is not diaphoretic.     ED Course / MDM  [id number]   I have reviewed the labs performed to date as well as medications administered while in observation.  Recent changes in the last 24 hours include -as below Results for orders placed or performed during the hospital encounter of 09/21/18  SARS Coronavirus 2 (CEPHEID - Performed in [place] hospital lab), [facility] Order  Result Value Ref Range   SARS Coronavirus 2 NEGATIVE NEGATIVE  SARS Coronavirus 2 (CEPHEID - Performed in [place] hospital lab), [facility] Order  Result Value Ref Range   SARS Coronavirus 2 NEGATIVE NEGATIVE  Comprehensive metabolic panel  Result Value Ref Range   Sodium 141 135 - 145 mmol/L   Potassium 4.4 3.5 - 5.1 mmol/L   Chloride 106 98 - 111 mmol/L   CO2 25 22 - 32 mmol/L   Glucose, Bld 107 (H) 70 - 99 mg/dL   BUN 41 (H) 8 - 23 mg/dL   Creatinine, Ser 2.19 (H) 0.61 - 1.24 mg/dL   Calcium 9.0 8.9 - 10.3 mg/dL   Total Protein 6.6 6.5 - 8.1 g/dL   Albumin 3.8 3.5 - 5.0 g/dL   AST 19 15 - 41 U/L   ALT 17 0 - 44 U/L   Alkaline Phosphatase 90 38 - 126 U/L   Total Bilirubin 0.4 0.3 - 1.2 mg/dL   GFR calc non Af Amer 25 (L) >60 mL/min   GFR calc Af Amer 29 (L) >60  mL/min   Anion gap 10 5 - 15  CBC with Differential  Result Value Ref Range   WBC 7.8 4.0 - 10.5 K/uL   RBC 3.47 (L) 4.22 - 5.81 MIL/uL   Hemoglobin 10.7 (L) 13.0 - 17.0 g/dL   HCT 33.5 (L) 39.0 - 52.0 %   MCV 96.5 80.0 - 100.0 fL   MCH 30.8 26.0 - 34.0 pg   MCHC 31.9 30.0 - 36.0 g/dL   RDW 14.1 11.5 - 15.5 %   Platelets 187 150 - 400 K/uL   nRBC 0.0 0.0 - 0.2 %   Neutrophils Relative % 73 %   Neutro Abs 5.8 1.7 - 7.7 K/uL   Lymphocytes Relative 15 %   Lymphs Abs 1.1 0.7 - 4.0 K/uL   Monocytes Relative 9 %   Monocytes Absolute 0.7 0.1 - 1.0 K/uL   Eosinophils Relative 1 %   Eosinophils Absolute 0.1 0.0 - 0.5 K/uL   Basophils Relative 1 %   Basophils Absolute 0.0 0.0 - 0.1 K/uL   Immature Granulocytes 1 %   Abs Immature Granulocytes 0.04 0.00 - 0.07 K/uL  Urinalysis, Routine w reflex microscopic  Result Value Ref Range  Color, Urine YELLOW YELLOW   APPearance CLEAR CLEAR   Specific Gravity, Urine 1.011 1.005 - 1.030   pH 7.0 5.0 - 8.0   Glucose, UA NEGATIVE NEGATIVE mg/dL   Hgb urine dipstick NEGATIVE NEGATIVE   Bilirubin Urine NEGATIVE NEGATIVE   Ketones, ur NEGATIVE NEGATIVE mg/dL   Protein, ur NEGATIVE NEGATIVE mg/dL   Nitrite NEGATIVE NEGATIVE   Leukocytes,Ua NEGATIVE NEGATIVE    Plan  Current plan is for placement . Patient is under full IVC at this time.   Margarita Mail, PA-C 10/05/18 0813    Lajean Saver, MD 10/05/18 [phone]

## 2018-10-05 NOTE — ED Notes (Signed)
Checked patient vitals patient is sleeping

## 2018-10-05 NOTE — Progress Notes (Signed)
CSW staffed case with [facility] Surveyor, quantity. CSW and [facility] contacted admissions at [place] to discuss private pay options. CSW noted they may be able to take patient Monday but requested additional information. CSW faxed additional information over and informed Tammy with admissions. CSW spoke with [facility] [phone]) and noted she would not be available onsite but can be reached via fax to sign admissions documents at [phone] if patient is offered bed.  Lamonte Richer, LCSW, [place] Worker II [phone]

## 2018-10-05 NOTE — ED Notes (Signed)
Pt continues to rest w/eyes closed. Respirations even, unlabored. Pt has been moving about on bed intermittently.

## 2018-10-05 NOTE — ED Notes (Signed)
Pt's lunch tray served - pt refuses to wake up to eat. Respirations even, unlabored.

## 2018-10-05 NOTE — ED Notes (Signed)
Pt's brother, Christy Sartorius, called - inquiring if SNF or "long-term care facility" is being sought - states this is what he wants for pt. Recommended for him to contact pt's POA.

## 2018-10-05 NOTE — ED Notes (Signed)
Pt now awake - eating late lunch. Pt noted to be alert, calm, and pleasant.

## 2018-10-06 DIAGNOSIS — R413 Other amnesia: Secondary | ICD-10-CM | POA: Diagnosis not present

## 2018-10-06 NOTE — ED Provider Notes (Signed)
Emergency Medicine Observation Re-evaluation Note  Lawrence Hicks is a 85 y.o. male, seen on rounds today.  Pt initially presented to the ED for complaints of Aggressive Behavior Currently, the patient is sleeping. He has been boarding for 2 weeks. Had a long discussion with his nursing staff today.  The patient has had intermittent aggressive behavior and this is apparently impeding his placement.  I have reached out to Dr.  Buford Dresser.  She saw him on his initial psychiatric visit.  Of asked for recommendations of medication to decrease his aggressive outbursts. .  Physical Exam  BP (!) 144/62 (BP Location: Right Arm)   Pulse 66   Temp 98 F (36.7 C) (Oral)   Resp 18   Ht 5\' 6"  (1.676 m)   Wt 61.2 kg   SpO2 98%   BMI 21.79 kg/m  Physical Exam Nursing note and vitals reviewed. Constitutional:  He appears well-developed and well-nourished. No distress.  Head: Normocephalic and atraumatic.  Pulmonary/Chest: Effort normal.No respiratory distress.  Abdominal: No distension Neurological: Sleeping. Skin: He is not diaphoretic.  ED Course / MDM  [id number]   I have reviewed the labs performed to date as well as medications administered while in observation.  Recent changes in the last 24 hours include none.  Results for orders placed or performed during the hospital encounter of 09/21/18  SARS Coronavirus 2 (CEPHEID - Performed in [place] hospital lab), [facility] Order  Result Value Ref Range   SARS Coronavirus 2 NEGATIVE NEGATIVE  SARS Coronavirus 2 (CEPHEID - Performed in [place] hospital lab), [facility] Order  Result Value Ref Range   SARS Coronavirus 2 NEGATIVE NEGATIVE  Comprehensive metabolic panel  Result Value Ref Range   Sodium 141 135 - 145 mmol/L   Potassium 4.4 3.5 - 5.1 mmol/L   Chloride 106 98 - 111 mmol/L   CO2 25 22 - 32 mmol/L   Glucose, Bld 107 (H) 70 - 99 mg/dL   BUN 41 (H) 8 - 23 mg/dL   Creatinine, Ser 2.19 (H) 0.61 - 1.24 mg/dL   Calcium 9.0 8.9 -  10.3 mg/dL   Total Protein 6.6 6.5 - 8.1 g/dL   Albumin 3.8 3.5 - 5.0 g/dL   AST 19 15 - 41 U/L   ALT 17 0 - 44 U/L   Alkaline Phosphatase 90 38 - 126 U/L   Total Bilirubin 0.4 0.3 - 1.2 mg/dL   GFR calc non Af Amer 25 (L) >60 mL/min   GFR calc Af Amer 29 (L) >60 mL/min   Anion gap 10 5 - 15  CBC with Differential  Result Value Ref Range   WBC 7.8 4.0 - 10.5 K/uL   RBC 3.47 (L) 4.22 - 5.81 MIL/uL   Hemoglobin 10.7 (L) 13.0 - 17.0 g/dL   HCT 33.5 (L) 39.0 - 52.0 %   MCV 96.5 80.0 - 100.0 fL   MCH 30.8 26.0 - 34.0 pg   MCHC 31.9 30.0 - 36.0 g/dL   RDW 14.1 11.5 - 15.5 %   Platelets 187 150 - 400 K/uL   nRBC 0.0 0.0 - 0.2 %   Neutrophils Relative % 73 %   Neutro Abs 5.8 1.7 - 7.7 K/uL   Lymphocytes Relative 15 %   Lymphs Abs 1.1 0.7 - 4.0 K/uL   Monocytes Relative 9 %   Monocytes Absolute 0.7 0.1 - 1.0 K/uL   Eosinophils Relative 1 %   Eosinophils Absolute 0.1 0.0 - 0.5 K/uL   Basophils Relative  1 %   Basophils Absolute 0.0 0.0 - 0.1 K/uL   Immature Granulocytes 1 %   Abs Immature Granulocytes 0.04 0.00 - 0.07 K/uL  Urinalysis, Routine w reflex microscopic  Result Value Ref Range   Color, Urine YELLOW YELLOW   APPearance CLEAR CLEAR   Specific Gravity, Urine 1.011 1.005 - 1.030   pH 7.0 5.0 - 8.0   Glucose, UA NEGATIVE NEGATIVE mg/dL   Hgb urine dipstick NEGATIVE NEGATIVE   Bilirubin Urine NEGATIVE NEGATIVE   Ketones, ur NEGATIVE NEGATIVE mg/dL   Protein, ur NEGATIVE NEGATIVE mg/dL   Nitrite NEGATIVE NEGATIVE   Leukocytes,Ua NEGATIVE NEGATIVE    Plan  Current plan is for patient will continue to board until he has been placed.  Apparently the patient's healthcare power of attorney who is also an attorney herself is resistant to alternatives including plan to have him go back to his house with payment for 24-hour care.  Currently social work is continuing to try and find nursing home placement.  Hopefully we can get his aggressive behavior under better control which  should improve his chances for placement.. Patient is under full IVC at this time.   Margarita Mail, PA-C 10/06/18 0801    Fredia Sorrow, MD 10/06/18 1057

## 2018-10-06 NOTE — ED Notes (Signed)
Patient denies pain and is resting comfortably.  

## 2018-10-06 NOTE — ED Notes (Signed)
Lunch order placed

## 2018-10-06 NOTE — ED Notes (Signed)
Pt's dinner tray arrived. Pt offered dinner, but declined food at this time. Tray is at pt's bedside.

## 2018-10-06 NOTE — ED Notes (Signed)
Pt sitting on side of bed eating breakfast.

## 2018-10-06 NOTE — ED Notes (Signed)
Pt lunch tray @ bedside. Pt ate banana pudding and drank diet coke. Pt didn't like the dinner offered. Pt asking for ice cream.

## 2018-10-06 NOTE — ED Notes (Signed)
Pt resting. No snack provided.

## 2018-10-06 NOTE — ED Notes (Signed)
Pt ate about 65% of breakfast.

## 2018-10-06 NOTE — ED Notes (Signed)
Pt's lunch tray arrived. 

## 2018-10-06 NOTE — ED Notes (Signed)
Pt's breakfast tray arrived 

## 2018-10-07 DIAGNOSIS — R413 Other amnesia: Secondary | ICD-10-CM | POA: Diagnosis not present

## 2018-10-07 NOTE — ED Notes (Signed)
Breakfast tray ordered 

## 2018-10-07 NOTE — Progress Notes (Signed)
Physical Therapy Treatment Patient Details Name: Lawrence Hicks MRN: [id number] DOB: [birth year] Today's Date: 10/07/2018    History of Present Illness Pt is a 85 y.o. male admitted to ED 09/21/18 with AMS and aggresive behavior, family thought he had UTI; brother had called for IVC. PMH includes LBBB.    PT Comments    Pt performed gait training and functional mobility.  He continues to present with confusion but easy to redirect.  Pt continues to await placement.  Pt pleasant and cooperative despite confusion.  He continues to require 24 hr assistance and proves to be a high fall risk.  Based on his needs he continues to benefit from skilled rehab in a SNF.      Follow Up Recommendations  SNF;Supervision/Assistance - 24 hour     Equipment Recommendations  Rolling walker with 5" wheels    Recommendations for Other Services Other (comment)(worth considering pallative care)     Precautions / Restrictions Precautions Precautions: Fall Restrictions Weight Bearing Restrictions: No    Mobility  Bed Mobility Overal bed mobility: Modified Independent         Sit to supine: Modified independent (Device/Increase time)   General bed mobility comments: Pt able toi return to supine from seated position edge of bed without assistance.    Transfers Overall transfer level: Needs assistance Equipment used: Rolling walker (2 wheeled) Transfers: Sit to/from Stand Sit to Stand: Min assist         General transfer comment: Min assistance for hand placement to and from seated surface.  pt reports pain in low back during transition and he is unable to achieve erect stance due to chronic kyphosis.    Ambulation/Gait Ambulation/Gait assistance: Min assist Gait Distance (Feet): 40 Feet Assistive device: Rolling walker (2 wheeled) Gait Pattern/deviations: Trunk flexed;Shuffle;Step-through pattern;Decreased stride length Gait velocity: Decreased   General Gait Details: Pt performed  increased gait distance.  he required cues for forward gaze and upper trunk control.  He continues to require assistance for turning and backing to maintain balance and manuever RW.     Stairs             Wheelchair Mobility    Modified Rankin (Stroke Patients Only)       Balance Overall balance assessment: Needs assistance   Sitting balance-Leahy Scale: Good       Standing balance-Leahy Scale: Poor Standing balance comment: reliant on RW                            Cognition Arousal/Alertness: Awake/alert Behavior During Therapy: WFL for tasks assessed/performed Overall Cognitive Status: History of cognitive impairments - at baseline Area of Impairment: Orientation;Memory;Problem solving;Safety/judgement                 Orientation Level: Disoriented to;Situation;Place;Time   Memory: Decreased short-term memory   Safety/Judgement: Decreased awareness of safety;Decreased awareness of deficits   Problem Solving: Slow processing;Decreased initiation;Requires verbal cues General Comments: Pt seated in recliner on arrival slid forward.  He wanted PTA to walk to his mailbox.  PTA had to reorient and educate patient on where he was and why he was here.  Pt following commands well and pleasant throughout session.        Exercises      General Comments        Pertinent Vitals/Pain Pain Assessment: Faces Faces Pain Scale: Hurts little more Pain Location: back Pain Intervention(s): Monitored during session;Limited activity within patient's tolerance  Home Living                      Prior Function            PT Goals (current goals can now be found in the care plan section) Acute Rehab PT Goals Patient Stated Goal: To get to his mailbox and brush his hair Potential to Achieve Goals: Fair Progress towards PT goals: Progressing toward goals    Frequency    Min 3X/week      PT Plan Current plan remains appropriate     Co-evaluation              AM-PAC PT "6 Clicks" Mobility   Outcome Measure  Help needed turning from your back to your side while in a flat bed without using bedrails?: None Help needed moving from lying on your back to sitting on the side of a flat bed without using bedrails?: None Help needed moving to and from a bed to a chair (including a wheelchair)?: A Little Help needed standing up from a chair using your arms (e.g., wheelchair or bedside chair)?: A Little Help needed to walk in hospital room?: A Little Help needed climbing 3-5 steps with a railing? : A Lot 6 Click Score: 19    End of Session Equipment Utilized During Treatment: Gait belt Activity Tolerance: Patient tolerated treatment well Patient left: in chair;with call bell/phone within reach;with nursing/sitter in room Nurse Communication: Mobility status PT Visit Diagnosis: Unsteadiness on feet (R26.81);Adult, failure to thrive (R62.7)     Time: 8185-6314 PT Time Calculation (min) (ACUTE ONLY): 17 min  Charges:  $Gait Training: 8-22 mins                     Governor Rooks, PTA Acute Rehabilitation Services Pager [phone] Office [place] 10/07/2018, 12:30 PM

## 2018-10-07 NOTE — ED Notes (Signed)
SI/HI Assessment:  When asked if the pt had any feelings of wanting to hurt himself or anyone else he replied with: "I don't know how to answer that question."  He denied any complaints. He questioned what the plan was for his placement. Pt calm and cooperative.

## 2018-10-07 NOTE — ED Provider Notes (Signed)
Emergency Medicine Observation Re-evaluation Note  Lawrence Hicks is a 85 y.o. male, seen on rounds today.  Pt initially presented to the ED for complaints of Aggressive Behavior Currently, the patient is sleeping.  Physical Exam  BP (!) 173/76 (BP Location: Right Arm)   Pulse 70   Temp 97.6 F (36.4 C) (Oral)   Resp 18   Ht 5\' 6"  (1.676 m)   Wt 61.2 kg   SpO2 94%   BMI 21.79 kg/m  Physical Exam Constitutional: Patient appears well-developed and well-nourished. No distress.  HENT:  Head: Normocephalic and atraumatic.  Pulmonary/Chest: Effort normal  No respiratory distress.  Abdominal: No distension Neurological: Pt is sleeping.  Skin:  Pt is not diaphoretic.  ED Course / MDM  EPP:IRJJ   I have reviewed the labs performed to date as well as medications administered while in observation. Results for orders placed or performed during the hospital encounter of 09/21/18  SARS Coronavirus 2 (CEPHEID - Performed in [place] hospital lab), [facility] Order  Result Value Ref Range   SARS Coronavirus 2 NEGATIVE NEGATIVE  SARS Coronavirus 2 (CEPHEID - Performed in [place] hospital lab), [facility] Order  Result Value Ref Range   SARS Coronavirus 2 NEGATIVE NEGATIVE  Comprehensive metabolic panel  Result Value Ref Range   Sodium 141 135 - 145 mmol/L   Potassium 4.4 3.5 - 5.1 mmol/L   Chloride 106 98 - 111 mmol/L   CO2 25 22 - 32 mmol/L   Glucose, Bld 107 (H) 70 - 99 mg/dL   BUN 41 (H) 8 - 23 mg/dL   Creatinine, Ser 2.19 (H) 0.61 - 1.24 mg/dL   Calcium 9.0 8.9 - 10.3 mg/dL   Total Protein 6.6 6.5 - 8.1 g/dL   Albumin 3.8 3.5 - 5.0 g/dL   AST 19 15 - 41 U/L   ALT 17 0 - 44 U/L   Alkaline Phosphatase 90 38 - 126 U/L   Total Bilirubin 0.4 0.3 - 1.2 mg/dL   GFR calc non Af Amer 25 (L) >60 mL/min   GFR calc Af Amer 29 (L) >60 mL/min   Anion gap 10 5 - 15  CBC with Differential  Result Value Ref Range   WBC 7.8 4.0 - 10.5 K/uL   RBC 3.47 (L) 4.22 - 5.81 MIL/uL   Hemoglobin 10.7  (L) 13.0 - 17.0 g/dL   HCT 33.5 (L) 39.0 - 52.0 %   MCV 96.5 80.0 - 100.0 fL   MCH 30.8 26.0 - 34.0 pg   MCHC 31.9 30.0 - 36.0 g/dL   RDW 14.1 11.5 - 15.5 %   Platelets 187 150 - 400 K/uL   nRBC 0.0 0.0 - 0.2 %   Neutrophils Relative % 73 %   Neutro Abs 5.8 1.7 - 7.7 K/uL   Lymphocytes Relative 15 %   Lymphs Abs 1.1 0.7 - 4.0 K/uL   Monocytes Relative 9 %   Monocytes Absolute 0.7 0.1 - 1.0 K/uL   Eosinophils Relative 1 %   Eosinophils Absolute 0.1 0.0 - 0.5 K/uL   Basophils Relative 1 %   Basophils Absolute 0.0 0.0 - 0.1 K/uL   Immature Granulocytes 1 %   Abs Immature Granulocytes 0.04 0.00 - 0.07 K/uL  Urinalysis, Routine w reflex microscopic  Result Value Ref Range   Color, Urine YELLOW YELLOW   APPearance CLEAR CLEAR   Specific Gravity, Urine 1.011 1.005 - 1.030   pH 7.0 5.0 - 8.0   Glucose, UA NEGATIVE NEGATIVE mg/dL  Hgb urine dipstick NEGATIVE NEGATIVE   Bilirubin Urine NEGATIVE NEGATIVE   Ketones, ur NEGATIVE NEGATIVE mg/dL   Protein, ur NEGATIVE NEGATIVE mg/dL   Nitrite NEGATIVE NEGATIVE   Leukocytes,Ua NEGATIVE NEGATIVE    Plan  Current plan is for continued social work involvement and seeking placement. Awaiting med guidance from Psychiatry- hopefully this will occur today. Patient is under full IVC at this time.   Margarita Mail, PA-C 10/07/18 0800    Deno Etienne, DO 10/07/18 1120

## 2018-10-07 NOTE — Progress Notes (Addendum)
1:45pm: CSW received call from [place] at [facility] who stated this patient was chosen for the only bed that was available today. CSW updated [employer].  CSW placed additional call to Tammy of [facility] to follow up, Tammy to return call after further discussion with director.  12pm: CSW spoke with Surveyor, quantity, Nathaniel Man to discuss further recommendations for this patient. Zack spoke with Tammy at [facility] who agreed to review documentation. CSW faxed information to facility for review.  10am: CSW spoke with Tammy of [facility] to discuss bed availibility and Tammy stated that both [place] facilities would not be able to accept this patient due to his complex needs. Tammy stated that her supervisor Wilfred Lacy and [facility]' director Shirlean Mylar were reviewing this patient for possible placement in [place] in a more secured unit. CSW awaiting return call for more information.  CSW spoke with staff member at [facility] to discuss recent information that was sent to facility, staff member stated the information is currently under review. Per destination tab in [place], information was received just over one hour ago.  CSW to continue following.  Madilyn Fireman, MSW, [place] Clinical Social Worker Emergency Department [employer] [phone]

## 2018-10-08 DIAGNOSIS — R413 Other amnesia: Secondary | ICD-10-CM | POA: Diagnosis not present

## 2018-10-08 NOTE — ED Notes (Signed)
Pt woke and stated "Oh yea, I'm wet". Pt noted to be calm and pleasant at this time.

## 2018-10-08 NOTE — ED Notes (Signed)
Pt refused breakfast and is also refusing lunch even after much encouragement.

## 2018-10-08 NOTE — Progress Notes (Signed)
CSW received email from [place] requesting confirmation of the [facility] Department serving this patient with his guardianship paperwork. CSW informed Mr. Aundra Dubin that the patient was served this morning around 9:20am.  CSW has been in contact with Nathaniel Man, [place] of [facility] department to obtain recommendations for this patient. Edwyna Ready is assisting CSW in securing placement.   CSW spoke with admissions at [facility] in [place] [phone]) to discuss this patient and submit a referral. CSW faxed [phone]) over documentation to facility for review.  CSW left voicemail with Katharine Look at [facility] in [place], [place] to discuss this patient and submit a referral. CSW faxed [phone]) over documentation to facility for review.  CSW received call from Dorise Hiss [phone]) to discuss upcoming court date for patient on 10/10/2018. Bethany asked questions regarding patient's current level of care, his medical providers, and general information. CSW provided clinical input in regards to [facility] lack of capacity and him being unable to make good decisions for himself.   Madilyn Fireman, MSW, LCSW-A Clinical Social Worker [facility] [phone]

## 2018-10-08 NOTE — ED Provider Notes (Signed)
Emergency Medicine Observation Re-evaluation Note  Lawrence Hicks is a 85 y.o. male, seen on rounds today.  Pt initially presented to the ED for complaints of SNF Placement Currently, the patient is awake and alert.  He is calm, but states he does not know why he is here and thinks he may have been kidnapped.  He does not have any other current complaints.  Physical Exam  BP (!) 169/61 (BP Location: Right Arm)   Pulse 68   Temp 98 F (36.7 C) (Oral)   Resp 18   Ht 5\' 6"  (1.676 m)   Wt 61.2 kg   SpO2 98%   BMI 21.79 kg/m  Physical Exam Vitals signs and nursing note reviewed.  Constitutional:      General: He is not in acute distress.    Appearance: He is well-developed. He is not diaphoretic.  HENT:     Head: Normocephalic and atraumatic.  Eyes:     Conjunctiva/sclera: Conjunctivae normal.  Neck:     Musculoskeletal: Neck supple.  Cardiovascular:     Rate and Rhythm: Normal rate and regular rhythm.  Pulmonary:     Effort: Pulmonary effort is normal.  Skin:    General: Skin is warm and dry.     Coloration: Skin is not pale.  Neurological:     Mental Status: He is alert.  Psychiatric:        Behavior: Behavior normal.     ED Course / MDM  KPV:VZSM     Abnormal Labs Reviewed  COMPREHENSIVE METABOLIC PANEL - Abnormal; Notable for the following components:      Result Value   Glucose, Bld 107 (*)    BUN 41 (*)    Creatinine, Ser 2.19 (*)    GFR calc non Af Amer 25 (*)    GFR calc Af Amer 29 (*)    All other components within normal limits  CBC WITH DIFFERENTIAL/PLATELET - Abnormal; Notable for the following components:   RBC 3.47 (*)    Hemoglobin 10.7 (*)    HCT 33.5 (*)    All other components within normal limits   I have reviewed the labs performed to date as well as medications administered while in observation.  No changes in the last 24 hours. Plan  Current plan is for placement to a skilled nursing facility.  He was evaluated by TTS after his initial  presentation to the ED on May 2 and then psych NP  May 3.  He did not meet inpatient psych criteria at that time. As of yesterday morning, social worker states patient is currently under review for possible SNF placement. Patient is under full IVC at this time.       Vitals:   10/07/18 0800 10/07/18 1459 10/07/18 2216 10/08/18 1518  BP: (!) 156/78 (!) 144/60 (!) 169/61 133/74  Pulse: 64 (!) 56 68 74  Resp: 16 17 18 18   Temp: (!) 97.5 F (36.4 C) (!) 97.5 F (36.4 C) 98 F (36.7 C) 98.3 F (36.8 C)  TempSrc: Oral Oral Oral Oral  SpO2: 100% 95% 98% 100%  Weight:      Height:          Layla Maw 10/08/18 1650    Virgel Manifold, MD 10/09/18 1218

## 2018-10-08 NOTE — ED Notes (Signed)
Pt given milk and graham crackers @ snack time.

## 2018-10-08 NOTE — ED Notes (Signed)
Pt noted to be irritable this am - refusing VS's to be taken and refusing med - will re-attempt when pt noted to be calmer.

## 2018-10-08 NOTE — ED Notes (Signed)
Lunch @ bedside.

## 2018-10-08 NOTE — ED Notes (Signed)
Lunch order placed

## 2018-10-08 NOTE — ED Notes (Signed)
Deputy served Proofreader of Hearing on Incompetence, Motion in the Cause, and Order Appointing Guardian Ad Litem" - court date for Interim Guardian is 10/10/2018 @ 1000 and 11/21/2018 @ 1500 for "Date of Hearing". When Deputy gave pt the papers, pt threw them across the room. Paperwork was retrieved and placed on clipboard. Copy will be made for Medical Records.

## 2018-10-09 NOTE — Progress Notes (Addendum)
2:00pm: CSW made appropriate adjustments to patient's FL2 to be sent to facilities for review of placement in an ALF facility. CSW spoke with Jimmie Molly at [facility] to provide referral and obtain fax number for sending over documentation for review. Pamala Hurry informed CSW that the facility offers in house physical therapy services through [facility] and that the monthly cost of residing at [facility] is $3900 a month. CSW faxed patient's face sheet, H & P, FL2, and his negative COVID results to [place] at [phone] for review.  CSW spoke with EDP Dr. Stark Jock to discuss the patient's dementia diagnosis. CSW and Dr. Stark Jock discussed current plan and MD was agreeable.  1pm: CSW received call from Victorino Dike to discuss this patient. CSW and HCPOA discussed the challenges of securing placement for Lawrence Hicks and also possibility of Lawrence Hicks returning home with 24/7 home health services and a thorough cleaning of the home prior to discharge. HCPOA stated that the patient has a guardianship hearing tomorrow for a designated guardian, which may end up being herself or DSS. HCPOA stated that she is "luke warm" to this idea. HCPOA states that she is hesitant to agree to this plan due to not knowing the costs of the 24/7 [facility] care and the possibility of the patient "firing" the aide and not allowing the [facility] agency staff members to enter the home. HCPOA asked about getting APS involved and CSW explained to [facility] that the initial APS report that was made was closed after the home visit was completed by Zebedee Iba in April 2020.  10am: CSW placed call to patient's [facility], there was no answer so a voicemail was left requesting a return call.  Madilyn Fireman, MSW, LCSW-A Clinical Social Worker [facility] [phone]

## 2018-10-09 NOTE — ED Notes (Signed)
Sitter here sent from staffing.

## 2018-10-09 NOTE — ED Notes (Signed)
Pt is sitting up in bed eating breakfast at this time.

## 2018-10-09 NOTE — ED Notes (Signed)
Dinner tray ordered.

## 2018-10-09 NOTE — ED Provider Notes (Signed)
12 YOM under IVC awaiting placement. No changes overnight. Pt is resting comfortably in exam bed.   Vitals:   10/09/18 0648 10/09/18 0700  BP: (!) 135/41   Pulse: 65   Resp:  16  Temp: (!) 97.3 F (36.3 C)   SpO2: 97%      Okey Regal, PA-C 10/09/18 7939    Davonna Belling, MD 10/09/18 1535

## 2018-10-09 NOTE — ED Notes (Addendum)
Pt stated he didn't want his BP medicine today bc yesterday they put it in applesauce and it made his vision blurry and made him feel funny. He states he does better without it. RN will attempt to administer BP medicine later.

## 2018-10-09 NOTE — ED Notes (Signed)
Patient's brother [phone]) called to get update about PT.  With permission from [place] and SW, this RN gave update  No further questions at this time.

## 2018-10-09 NOTE — Progress Notes (Signed)
PT Cancellation Note  Patient Details Name: Rajah Tagliaferro MRN: [id number] DOB: 01-Jan-1924   Cancelled Treatment:    Reason Eval/Treat Not Completed: Other (comment)   Relatively politely declining OOB and ambulation;  Tells me he just woke up and will walk later;   Will follow up later today as time allows;  Otherwise, will follow up for PT tomorrow;   Thank you,  Roney Marion, PT  Acute Rehabilitation Services Pager [phone] Office [phone]       Colletta [place] 10/09/2018, 9:09 AM

## 2018-10-09 NOTE — ED Notes (Signed)
Physical Therapy attempted to ambulate pt. Pt states he will do it later.

## 2018-10-09 NOTE — NC FL2 (Addendum)
  [place] LEVEL OF CARE SCREENING TOOL     IDENTIFICATION  Patient Name: Lawrence Hicks Birthdate: Jul 05, 1923 Sex: male Admission Date (Current Location): 09/21/2018  [facility] and [place] Number:  Herbalist and Address:  [facility], [place] [address] [place], [place] [address]      Provider Number: [id number]  Attending Physician Name and Address:  Default, Provider, MD  Relative Name and Phone Number:  Irene Limbo [phone]    Current Level of Care: Hospital Recommended Level of Care: [place] Prior Approval Number:    Date Approved/Denied:   PASRR Number: [id number]  Discharge Plan: Other (Comment)(ALF)    Current Diagnoses: Patient Active Problem List   Diagnosis Date Noted  . Evaluation by psychiatric service required   . Aggressive behavior of adult 09/22/2018  . Decreased breath sounds in left mid-lung field 09/25/2016  . Screening for hyperlipidemia 09/25/2016  . Nasal congestion 02/07/2016  . Cellulitis 08/10/2015  . Spontaneous pneumothorax 07/24/2015  . HTN (hypertension) 12/17/2012    Orientation RESPIRATION BLADDER Height & Weight     Self  Normal Incontinent Weight: 135 lb (61.2 kg) Height:  5\' 6"  (167.6 cm)  BEHAVIORAL SYMPTOMS/MOOD NEUROLOGICAL BOWEL NUTRITION STATUS  (Patient is easily agitated due to dementia)   Incontinent Diet  AMBULATORY STATUS COMMUNICATION OF NEEDS Skin   Limited Assist Verbally Normal                       Personal Care Assistance Level of Assistance  Dressing, Feeding, Bathing Bathing Assistance: Maximum assistance Feeding assistance: Limited assistance Dressing Assistance: Maximum assistance Total Care Assistance: Maximum assistance   Functional Limitations Info  Speech, Hearing, Sight Sight Info: Impaired Hearing Info: Impaired Speech Info: Adequate    SPECIAL CARE FACTORS FREQUENCY  OT (By licensed OT), PT (By licensed PT)      PT Frequency: 5x weekly OT Frequency: 5x weekly            Contractures Contractures Info: Not present    Additional Factors Info  Allergies   Allergies Info: Coreg (Carvedilol)           Current Medications (10/10/2018):  This is the current hospital active medication list Current Facility-Administered Medications  Medication Dose Route Frequency Provider Last Rate Last Dose  . acetaminophen (TYLENOL) tablet 650 mg  650 mg Oral Q6H PRN Carmin Muskrat, MD   650 mg at 09/24/18 0915  . amLODipine (NORVASC) tablet 5 mg  5 mg Oral Daily Charlesetta Shanks, MD   5 mg at 10/08/18 1521  . tuberculin injection 5 Units  5 Units Intradermal Once Okey Regal, PA-C   5 Units at 10/10/18 1246   Current Outpatient Medications  Medication Sig Dispense Refill  . ramipril (ALTACE) 2.5 MG capsule TAKE 1 CAPSULE (2.5 MG TOTAL) BY MOUTH DAILY. 90 capsule 0  . [facility] COUGH DM 30 MG/5ML liquid TAKE 5 MLS (30 MG TOTAL) BY MOUTH 2 (TWO) TIMES DAILY AS NEEDED FOR COUGH. (Patient not taking: Reported on 09/22/2018) 89 mL 0     Discharge Medications: Please see discharge summary for a list of discharge medications.  Relevant Imaging Results:  Relevant Lab Results:   Additional Information SSN: [id number]  Archie Endo, LCSW

## 2018-10-09 NOTE — ED Notes (Signed)
Patient denies pain and is resting comfortably.  

## 2018-10-09 NOTE — ED Notes (Signed)
Pt's breakfast tray at bedside and pt encouraged to eat but stated he was not hungry at this time

## 2018-10-10 DIAGNOSIS — R413 Other amnesia: Secondary | ICD-10-CM | POA: Diagnosis not present

## 2018-10-10 MED ORDER — TUBERCULIN PPD 5 UNIT/0.1ML ID SOLN
5.0000 [IU] | Freq: Once | INTRADERMAL | Status: DC
  Administered 2018-10-10: 5 [IU] via INTRADERMAL
  Filled 2018-10-10 (×2): qty 0.1

## 2018-10-10 NOTE — NC FL2 (Signed)
  [place] LEVEL OF CARE SCREENING TOOL     IDENTIFICATION  Patient Name: Lawrence Hicks Birthdate: 1924/04/11 Sex: male Admission Date (Current Location): 09/21/2018  [facility] and [place] Number:  Herbalist and Address:  [facility], [place] [address], [place], [place] [address]      Provider Number: [id number]  Attending Physician Name and Address:  Default, Provider, MD  Relative Name and Phone Number:  Irene Limbo [phone]    Current Level of Care: Hospital Recommended Level of Care: [place] Prior Approval Number:    Date Approved/Denied:   PASRR Number: [id number]  Discharge Plan: Other (Comment)(ALF)     Orientation RESPIRATION BLADDER Height & Weight     Self  Normal Incontinent Weight: 135 lb (61.2 kg) Height:  5\' 6"  (167.6 cm)  BEHAVIORAL SYMPTOMS/MOOD NEUROLOGICAL BOWEL NUTRITION STATUS  (Patient is easily agitated due to dementia)   Incontinent Diet  AMBULATORY STATUS COMMUNICATION OF NEEDS Skin   Limited Assist Verbally Normal                       Personal Care Assistance Level of Assistance  Dressing, Feeding, Bathing Bathing Assistance: Maximum assistance Feeding assistance: Limited assistance Dressing Assistance: Maximum assistance Total Care Assistance: Maximum assistance   Functional Limitations Info  Speech, Hearing, Sight Sight Info: Impaired Hearing Info: Impaired Speech Info: Adequate    SPECIAL CARE FACTORS FREQUENCY  OT (By licensed OT), PT (By licensed PT)     PT Frequency: 5x weekly OT Frequency: 5x weekly            Contractures Contractures Info: Not present    Additional Factors Info  Allergies   Allergies Info: Coreg (Carvedilol)           Current Medications (10/10/2018):  This is the current hospital active medication list Current Facility-Administered Medications  Medication Dose Route Frequency Provider Last Rate Last Dose   . acetaminophen (TYLENOL) tablet 650 mg  650 mg Oral Q6H PRN Carmin Muskrat, MD   650 mg at 09/24/18 0915  . amLODipine (NORVASC) tablet 5 mg  5 mg Oral Daily Charlesetta Shanks, MD   5 mg at 10/08/18 1521  . tuberculin injection 5 Units  5 Units Intradermal Once Okey Regal, PA-C   5 Units at 10/10/18 1246   Current Outpatient Medications  Medication Sig Dispense Refill  . ramipril (ALTACE) 2.5 MG capsule TAKE 1 CAPSULE (2.5 MG TOTAL) BY MOUTH DAILY. 90 capsule 0  . [facility] COUGH DM 30 MG/5ML liquid TAKE 5 MLS (30 MG TOTAL) BY MOUTH 2 (TWO) TIMES DAILY AS NEEDED FOR COUGH. (Patient not taking: Reported on 09/22/2018) 89 mL 0     Discharge Medications: Please see discharge summary for a list of discharge medications.  Relevant Imaging Results:  Relevant Lab Results:   Additional Information SSN: [id number]  Archie Endo, LCSW

## 2018-10-10 NOTE — Progress Notes (Signed)
OT Cancellation Note  Patient Details Name: Lawrence Hicks MRN: [id number] DOB: Aug 11, 1923   Cancelled Treatment:    Reason Eval/Treat Not Completed: Patient declined, no reason specified. Pt educated on the benefit of rehab, he reports," you stole my clothes and locked me in here." he was not able to be reasoned with or redirected. Pt just simply adamently refusing any and all participation. Will continue to follow acutely.   Merri Ray Lakresha Stifter 10/10/2018, 1:51 PM   Hulda Humphrey OTR/L Acute Rehabilitation Services Pager: [phone] Office: [phone]

## 2018-10-10 NOTE — Progress Notes (Addendum)
2:45pm: CSW placed additional call to [place] at [facility] to discuss patient. Patient has been accepted into the facility and a bed will be available for him tomorrow morning, 10/11/2018. CSW will arrange for transportation via [facility] first thing Friday morning to [facility]. Documentation will be completed by the facility via email with Victorino Dike. Victorino Dike was appointed as the patient's guardian. A different unnamed individual was appointed the guardian of the patient's property. HCPOA in total agreement with plan. CSW answered all questions and will notify HCPOA prior to patient being transferred in the morning.  1:45pm: CSW placed call to [facility] to obtain an update regarding the acceptance or denial of this patient into the facility, no answer so a voicemail was left requesting a return call.  9am: CSW spoke with Lynn Ito, who is [place] lawyer for the guardianship assignment for this patient. Mr. Aundra Dubin is a lawyer in [place] who specializes in elder law. Mr. Aundra Dubin stated that the guardianship hearing for this patient is at 10am today.  8am: SW spoke with Lenn Sink, PA in ED to discuss adding dementia as this patient's primary diagnosis so that documentation will reflect this. CSW also asked PA to complete an H&P for patient to be sent to facilities for review, PA agreeable. CSW notified PA that patient would need a TB skin test.   CSW spoke with Jimmie Molly at [facility] to inform her of updates to documentation that would be sent to her as soon as they were available. Pamala Hurry states her staff is reviewing this patient first thing this morning.  Madilyn Fireman, MSW, LCSW-A Clinical Social Worker [facility] [phone]

## 2018-10-10 NOTE — Care Management (Signed)
ED CM reviewed patient's record for current transitional care plans. FL2 completed and faxed out to [facility], patient is review. ED CSW will follow up in the am.

## 2018-10-10 NOTE — ED Notes (Signed)
Pt refused VS  

## 2018-10-10 NOTE — ED Notes (Signed)
Breakfast tray ordered 

## 2018-10-10 NOTE — ED Notes (Signed)
Ordered patient's lunch tray.

## 2018-10-10 NOTE — ED Provider Notes (Signed)
[facility] EMERGENCY DEPARTMENT Provider Note   CSN: [id number] Arrival date & time: 09/21/18  1926    History   Chief Complaint Chief Complaint  Patient presents with  . SNF Placement    HPI Lawrence Hicks is a 85 y.o. male.     HPI   85 year old male here at [facility] emergency room with prolonged stay.  Patient was originally brought as his family had come into town and noticed his behavior had changed.  He was aggressive with them and appeared confused.  Patient has been in the emergency room under our care.  Throughout this time he has shown signs of memory loss and confusion this does not appear to be acute or secondary to any infectious or lifted intracranial abnormality.  Throughout his stay he has had verbally aggressive and abusive outbursts but is easily calmed with limited interaction.  These are not random outbursts and seem to be aggravated by our requests.  He seems to be most agitated about the fact that he may not return home.  He is showed no signs of physical abuse or behavior while here in the emergency room that I am aware of.  Patient was IVCD he was seen by psychiatry who felt that he did not have capacity to refuse placement.  Patient has no physical complaints.  Past Medical History:  Diagnosis Date  . LBBB (left bundle branch block)     Patient Active Problem List   Diagnosis Date Noted  . Evaluation by psychiatric service required   . Aggressive behavior of adult 09/22/2018  . Decreased breath sounds in left mid-lung field 09/25/2016  . Screening for hyperlipidemia 09/25/2016  . Nasal congestion 02/07/2016  . Cellulitis 08/10/2015  . Spontaneous pneumothorax 07/24/2015  . HTN (hypertension) 12/17/2012    Past Surgical History:  Procedure Laterality Date  . CYST EXCISION     85 yrs old        Home Medications    Prior to Admission medications   Medication Sig Start Date End Date Taking? Authorizing Provider  ramipril  (ALTACE) 2.5 MG capsule TAKE 1 CAPSULE (2.5 MG TOTAL) BY MOUTH DAILY. 07/23/18  Yes Nahser, Wonda Cheng, MD  CVS COUGH DM 30 MG/5ML liquid TAKE 5 MLS (30 MG TOTAL) BY MOUTH 2 (TWO) TIMES DAILY AS NEEDED FOR COUGH. Patient not taking: Reported on 09/22/2018 08/03/15   Dellia Nims, MD    Family History Family History  Family history unknown: Yes    Social History Social History   Tobacco Use  . Smoking status: Former Smoker    Last attempt to quit: 08/10/1955    Years since quitting: 63.2  . Smokeless tobacco: Never Used  . Tobacco comment: 50 yrs ago  Substance Use Topics  . Alcohol use: Not on file  . Drug use: Not on file     Allergies   Coreg [carvedilol]   Review of Systems Review of Systems  Psychiatric/Behavioral: Positive for confusion.  All other systems reviewed and are negative.    Physical Exam Updated Vital Signs BP (!) 123/55 (BP Location: Left Arm)   Pulse 67   Temp 97.6 F (36.4 C) (Oral)   Resp 18   Ht 5\' 6"  (1.676 m)   Wt 61.2 kg   SpO2 97%   BMI 21.79 kg/m   Physical Exam Vitals signs and nursing note reviewed.  Constitutional:      Appearance: He is well-developed.  HENT:     Head: Normocephalic and  atraumatic.  Eyes:     General: No scleral icterus.       Right eye: No discharge.        Left eye: No discharge.     Conjunctiva/sclera: Conjunctivae normal.     Pupils: Pupils are equal, round, and reactive to light.  Neck:     Musculoskeletal: Normal range of motion.     Vascular: No JVD.     Trachea: No tracheal deviation.  Pulmonary:     Effort: Pulmonary effort is normal.     Breath sounds: No stridor.  Neurological:     Mental Status: He is alert and oriented to person, place, and time.     Coordination: Coordination normal.  Psychiatric:        Behavior: Behavior normal.        Thought Content: Thought content normal.        Judgment: Judgment normal.      ED Treatments / Results  Labs (all labs ordered are listed, but  only abnormal results are displayed) Labs Reviewed  COMPREHENSIVE METABOLIC PANEL - Abnormal; Notable for the following components:      Result Value   Glucose, Bld 107 (*)    BUN 41 (*)    Creatinine, Ser 2.19 (*)    GFR calc non Af Amer 25 (*)    GFR calc Af Amer 29 (*)    All other components within normal limits  CBC WITH DIFFERENTIAL/PLATELET - Abnormal; Notable for the following components:   RBC 3.47 (*)    Hemoglobin 10.7 (*)    HCT 33.5 (*)    All other components within normal limits  SARS CORONAVIRUS 2 (HOSPITAL ORDER, [place] [facility])  SARS CORONAVIRUS 2 (HOSPITAL ORDER, [place] [facility])  URINALYSIS, ROUTINE W REFLEX MICROSCOPIC  TB SKIN TEST    EKG None  Radiology No results found.  Procedures Procedures (including critical care time)  Medications Ordered in ED Medications  acetaminophen (TYLENOL) tablet 650 mg (650 mg Oral Given 09/24/18 0915)  amLODipine (NORVASC) tablet 5 mg (5 mg Oral Not Given 10/09/18 1200)  sodium chloride 0.9 % bolus 1,000 mL (0 mLs Intravenous Stopped 09/21/18 2342)     Initial Impression / Assessment and Plan / ED Course  I have reviewed the triage vital signs and the nursing notes.  Pertinent labs & imaging results that were available during my care of the patient were reviewed by me and considered in my medical decision making (see chart for details).        85 year old male here awaiting placement.  Patient does have some agitation and memory changes.  These do not appear to be acute in nature and are likely related to age.  Based on chart review and discussion with specialists it appears that he does not have capacity to take care of himself or to refuse placement to skilled nursing facility.  Sadly enough this does contribute to his agitation, but he has no signs of physical aggression.  After discussion with social work hopes are for placement into a skilled nursing facility.  Final  Clinical Impressions(s) / ED Diagnoses   Final diagnoses:  Confusion  Memory loss    ED Discharge Orders    None       Francee Gentile 10/10/18 1031    Elnora Morrison, MD 10/10/18 1534

## 2018-10-10 NOTE — Progress Notes (Signed)
PT Cancellation Note  Patient Details Name: Lawrence Hicks MRN: [id number] DOB: 1924/01/21   Cancelled Treatment:    Reason Eval/Treat Not Completed: (P) Other (comment)(Pt behavior limited therapy session.  Pt educated on the benefit of rehab, he reports," you stole my clothes and locked me in here." ) He was not able to redirect or reason with.  Will f/u per POC.     Teghan Philbin Eli Hose 10/10/2018, 11:03 AM  Governor Rooks, PTA Acute Rehabilitation Services Pager [phone] Office [phone]

## 2018-10-10 NOTE — ED Notes (Signed)
Dinner tray ordered for pt

## 2018-10-11 DIAGNOSIS — R279 Unspecified lack of coordination: Secondary | ICD-10-CM | POA: Diagnosis not present

## 2018-10-11 DIAGNOSIS — R410 Disorientation, unspecified: Secondary | ICD-10-CM | POA: Diagnosis not present

## 2018-10-11 DIAGNOSIS — I10 Essential (primary) hypertension: Secondary | ICD-10-CM | POA: Diagnosis not present

## 2018-10-11 DIAGNOSIS — F29 Unspecified psychosis not due to a substance or known physiological condition: Secondary | ICD-10-CM | POA: Diagnosis not present

## 2018-10-11 DIAGNOSIS — Z743 Need for continuous supervision: Secondary | ICD-10-CM | POA: Diagnosis not present

## 2018-10-11 DIAGNOSIS — R413 Other amnesia: Secondary | ICD-10-CM | POA: Diagnosis not present

## 2018-10-11 NOTE — ED Notes (Signed)
Breakfast ordered 

## 2018-10-11 NOTE — ED Notes (Signed)
Talked to pt Brother Christy Sartorius and let them know that the pt was placed at [facility] and I gave them the address and phone number.   [place] # [phone]

## 2018-10-11 NOTE — Progress Notes (Signed)
CSW faxed patient's FL2 and negative COVID results to Jimmie Molly at [facility]. CSW placed call to confirm those documents were received successfully and they were. Pamala Hurry will return call to [facility] whenever room information is available so that CSW can arrange for transport.  Madilyn Fireman, MSW, LCSW-A Clinical Social Worker [facility] [phone]

## 2018-10-11 NOTE — ED Notes (Signed)
Breakfast tray ordered 

## 2018-10-11 NOTE — ED Notes (Signed)
Called report to [facility].

## 2018-10-11 NOTE — ED Notes (Signed)
Copies made of all pts legal papers. Originals placed in envelope to go with pt.

## 2018-10-11 NOTE — ED Notes (Signed)
PT and OT at bedside

## 2018-10-11 NOTE — TOC Transition Note (Signed)
Transition of Care [facility]) - CM/SW Discharge Note   Patient Details  Name: Lawrence Hicks MRN: [id number] Date of Birth: 1923-09-05  Transition of Care [facility] CM/SW Contact:  Archie Endo, LCSW Phone Number: 10/11/2018, 9:25 AM   Clinical Narrative:     CSW arranged for transportation to [facility] via [facility]. Patient is going to room 201. The facility's accepting provider is Dr. Clemetine Marker. Number to call for report is [phone] option 3.   Final next level of care: Assisted Living Barriers to Discharge: No Barriers Identified   Patient Goals and CMS Choice   CMS Medicare.gov Compare Post Acute Care list provided to:: Legal Guardian([name] Bervorka) Choice offered to / list presented to : [facility] POA / [place]  Discharge Placement              Patient chooses bed at: [facility] Patient to be transferred to facility by: [place] Name of family member notified: Victorino Dike [phone]) Patient and family notified of of transfer: 10/11/18  Discharge Plan and Services    Social Determinants of Health (SDOH) Interventions    Readmission Risk Interventions No flowsheet data found.

## 2018-10-11 NOTE — Progress Notes (Signed)
Occupational Therapy Treatment Patient Details Name: Lawrence Hicks MRN: [id number] DOB: Jun 27, 1923 Today's Date: 10/11/2018    History of present illness Pt is a 85 y.o. male admitted to ED 09/21/18 with AMS and aggresive behavior, family thought he had UTI; brother had called for IVC. PMH includes LBBB.   OT comments  Pt progressing towards OT goals this session, agreeable to toilet transfer (min A with RW), grooming performing sit<>stand at sink (shaving, oral care, washing face, washing hands, deoderant) Pt able to perform with set up except for shaving which he requested OT do), washed entire body (assist for back and max A for LB) with sponge bath, and Pt was able to have a BM on the toilet (max A for rear peri care). 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 Current POC remains appropriate.    Follow Up Recommendations  SNF;Supervision/Assistance - 24 hour    Equipment Recommendations  3 in 1 bedside commode;Other (comment)    Recommendations for Other Services      Precautions / Restrictions Precautions Precautions: Fall Restrictions Weight Bearing Restrictions: No       Mobility Bed Mobility Overal bed mobility: Modified Independent Bed Mobility: Supine to Sit     Supine to sit: Modified independent (Device/Increase time);HOB elevated     General bed mobility comments: Increased time and effort; HOB elevated  Transfers Overall transfer level: Needs assistance Equipment used: Rolling walker (2 wheeled) Transfers: Sit to/from Stand Sit to Stand: Min assist         General transfer comment: Encouragement to use RW. MinA to assist trunk elevation from bed and chair; very forward flexed posture    Balance Overall balance assessment: Needs assistance Sitting-balance support: No upper extremity supported;Feet supported Sitting balance-Leahy Scale: Good     Standing balance support: Bilateral upper  extremity supported;During functional activity Standing balance-Leahy Scale: Poor Standing balance comment: reliant on RW; reaching to furniture/rails for support standing without DME                           ADL either performed or assessed with clinical judgement   ADL Overall ADL's : Needs assistance/impaired     Grooming: Moderate assistance;Wash/dry face;Wash/dry hands;Oral care;Applying deodorant;Brushing hair;Sitting(shaving) Grooming Details (indicate cue type and reason): Pt did everything for himself except for shaving. He requested that the OT assist him with shaving.  Upper Body Bathing: Minimal assistance;Sitting Upper Body Bathing Details (indicate cue type and reason): assist for back Lower Body Bathing: Maximal assistance;Sit to/from stand Lower Body Bathing Details (indicate cue type and reason): assist for below the knees, and rear for thoroughness Upper Body Dressing : Minimal assistance;Sitting       Toilet Transfer: RW;Minimal assistance Toilet Transfer Details (indicate cue type and reason): improved balance with RW Toileting- Clothing Manipulation and Hygiene: Maximal assistance Toileting - Clothing Manipulation Details (indicate cue type and reason): don adult brief and even assisted with holding it for staff to help him     Functional mobility during ADLs: Minimal assistance;Rolling walker       Vision       Perception     Praxis      Cognition Arousal/Alertness: Awake/alert Behavior During Therapy: WFL for tasks assessed/performed Overall Cognitive Status: History of cognitive impairments - at baseline Area of Impairment: Orientation;Memory;Problem solving;Safety/judgement                 Orientation Level:  Disoriented to;Situation;Place;Time   Memory: Decreased short-term memory   Safety/Judgement: Decreased awareness of safety;Decreased awareness of deficits Awareness: Intellectual Problem Solving: Slow  processing;Decreased initiation;Requires verbal cues          Exercises     Shoulder Instructions       General Comments      Pertinent Vitals/ Pain       Pain Assessment: No/denies pain  Home Living                                          Prior Functioning/Environment              Frequency  Min 2X/week        Progress Toward Goals  OT Goals(current goals can now be found in the care plan section)  Progress towards OT goals: Progressing toward goals  Acute Rehab OT Goals Patient Stated Goal: To go home today and get his personal belongings OT Goal Formulation: With patient Time For Goal Achievement: 10/11/18 Potential to Achieve Goals: [place] Discharge plan remains appropriate    Co-evaluation    PT/OT/SLP Co-Evaluation/Treatment: Yes Reason for Co-Treatment: Necessary to address cognition/behavior during functional activity;For patient/therapist safety;To address functional/ADL transfers PT goals addressed during session: Mobility/safety with mobility;Balance;Proper use of DME OT goals addressed during session: ADL's and self-care;Proper use of Adaptive equipment and DME      AM-PAC OT "6 Clicks" Daily Activity     Outcome Measure   Help from another person eating meals?: A Little Help from another person taking care of personal grooming?: A Little Help from another person toileting, which includes using toliet, bedpan, or urinal?: A Little Help from another person bathing (including washing, rinsing, drying)?: A Little Help from another person to put on and taking off regular upper body clothing?: A Little Help from another person to put on and taking off regular lower body clothing?: A Lot 6 Click Score: 17    End of Session Equipment Utilized During Treatment: Gait belt;Rolling walker  OT Visit Diagnosis: Unsteadiness on feet (R26.81);Muscle weakness (generalized) (M62.81)   Activity Tolerance Patient tolerated  treatment well   Patient Left in chair;with call bell/phone within reach;with nursing/sitter in room   Nurse Communication Mobility status;Precautions        Time: 4360-6770 OT Time Calculation (min): 39 min  Charges: OT General Charges $OT Visit: 1 Visit OT Treatments $Self Care/Home Management : 23-37 mins  Hulda Humphrey OTR/L Acute Rehabilitation Services Pager: [phone] Office: [place] 10/11/2018, 9:50 AM

## 2018-10-11 NOTE — Progress Notes (Signed)
Physical Therapy Treatment Patient Details Name: Lawrence Hicks MRN: [id number] DOB: Nov 04, 1923 Today's Date: 10/11/2018    History of Present Illness Pt is a 85 y.o. male admitted to ED 09/21/18 with AMS and aggresive behavior, family thought he had UTI; brother had called for IVC. PMH includes LBBB.    PT Comments    Pt progressing with mobility. Stability much improved with use of RW for ambulation; requires intermittent minA to prevent LOB while walking. Requires consistent minA to stand from varying surface heights. Pleasant and agreeable to participate. Continue to recommend SNF-level therapies.   Follow Up Recommendations  SNF;Supervision/Assistance - 24 hour     Equipment Recommendations  Rolling walker with 5" wheels    Recommendations for Other Services       Precautions / Restrictions Precautions Precautions: Fall Restrictions Weight Bearing Restrictions: No    Mobility  Bed Mobility Overal bed mobility: Modified Independent Bed Mobility: Supine to Sit     Supine to sit: Modified independent (Device/Increase time);HOB elevated     General bed mobility comments: Increased time and effort; HOB elevated  Transfers Overall transfer level: Needs assistance Equipment used: Rolling walker (2 wheeled) Transfers: Sit to/from Stand Sit to Stand: Min assist         General transfer comment: Encouragement to use RW. MinA to assist trunk elevation from bed and chair; very forward flexed posture  Ambulation/Gait Ambulation/Gait assistance: Min assist Gait Distance (Feet): 40 Feet Assistive device: Rolling walker (2 wheeled) Gait Pattern/deviations: Trunk flexed;Shuffle;Step-through pattern;Decreased stride length Gait velocity: Decreased Gait velocity interpretation: <1.8 ft/sec, indicate of risk for recurrent falls General Gait Details: Slow, mildly unsteady gait with RW; cues for forward gaze and upper trunk control as pt with very kyphotic/forward flexed  posture. Assist to prevent LOB with turns and backwards walking, intermittent assist to maneuver RW   Stairs             Wheelchair Mobility    Modified Rankin (Stroke Patients Only)       Balance Overall balance assessment: Needs assistance Sitting-balance support: No upper extremity supported;Feet supported Sitting balance-Leahy Scale: Good     Standing balance support: Bilateral upper extremity supported;During functional activity Standing balance-Leahy Scale: Poor Standing balance comment: reliant on RW; reaching to furniture/rails for support standing without DME                            Cognition Arousal/Alertness: Awake/alert Behavior During Therapy: WFL for tasks assessed/performed Overall Cognitive Status: History of cognitive impairments - at baseline Area of Impairment: Orientation;Memory;Problem solving;Safety/judgement                 Orientation Level: Disoriented to;Situation;Place;Time   Memory: Decreased short-term memory   Safety/Judgement: Decreased awareness of safety;Decreased awareness of deficits Awareness: Intellectual Problem Solving: Slow processing;Decreased initiation;Requires verbal cues        Exercises      General Comments        Pertinent Vitals/Pain Pain Assessment: No/denies pain    Home Living                      Prior Function            PT Goals (current goals can now be found in the care plan section) Acute Rehab PT Goals Patient Stated Goal: To go home today and get his personal belongings PT Goal Formulation: With patient Time For Goal Achievement: 10/09/18 Potential to Achieve Goals:  Fair Progress towards PT goals: Progressing toward goals    Frequency    Min 3X/week      PT Plan Current plan remains appropriate    Co-evaluation PT/OT/SLP Co-Evaluation/Treatment: Yes Reason for Co-Treatment: Necessary to address cognition/behavior during functional activity;For  patient/therapist safety;To address functional/ADL transfers PT goals addressed during session: Mobility/safety with mobility;Proper use of DME;Balance        AM-PAC PT "6 Clicks" Mobility   Outcome Measure  Help needed turning from your back to your side while in a flat bed without using bedrails?: None Help needed moving from lying on your back to sitting on the side of a flat bed without using bedrails?: None Help needed moving to and from a bed to a chair (including a wheelchair)?: A Little Help needed standing up from a chair using your arms (e.g., wheelchair or bedside chair)?: A Little Help needed to walk in hospital room?: A Little Help needed climbing 3-5 steps with a railing? : A Lot 6 Click Score: 19    End of Session   Activity Tolerance: Patient tolerated treatment well Patient left: in chair;with call bell/phone within reach;with nursing/sitter in room Nurse Communication: Mobility status PT Visit Diagnosis: Unsteadiness on feet (R26.81);Adult, failure to thrive (R62.7)     Time: 0810-0849 PT Time Calculation (min) (ACUTE ONLY): 39 min  Charges:  $Gait Training: 8-22 mins                    Mabeline Caras, PT, DPT Acute Rehabilitation Services  Pager [phone] Office [place] 10/11/2018, 8:56 AM

## 2018-10-15 ENCOUNTER — Other Ambulatory Visit: Payer: Self-pay

## 2018-10-15 DIAGNOSIS — F0281 Dementia in other diseases classified elsewhere with behavioral disturbance: Secondary | ICD-10-CM | POA: Diagnosis not present

## 2018-10-15 DIAGNOSIS — H1031 Unspecified acute conjunctivitis, right eye: Secondary | ICD-10-CM | POA: Diagnosis not present

## 2018-10-15 DIAGNOSIS — I10 Essential (primary) hypertension: Secondary | ICD-10-CM | POA: Diagnosis not present

## 2018-10-15 DIAGNOSIS — G301 Alzheimer's disease with late onset: Secondary | ICD-10-CM | POA: Diagnosis not present

## 2018-10-15 DIAGNOSIS — R8290 Unspecified abnormal findings in urine: Secondary | ICD-10-CM | POA: Diagnosis not present

## 2018-10-15 DIAGNOSIS — M1711 Unilateral primary osteoarthritis, right knee: Secondary | ICD-10-CM | POA: Diagnosis not present

## 2018-10-15 NOTE — Patient Outreach (Addendum)
[facility]) Care Management  10/15/2018  Avrom Robarts 1923-09-28 [id number]  Member did not admit to [facility]; however, remained at [facility] from 09-21-2018 to 10-11-2018 due to confusion and memory loss. Member was evaluated for SARS-Cov2 and test negative on 09-28-2018.   Member discharged to [facility] in [place], [place] room 201. Unable to send Case Closure Letter to PCP as member does not have PCP.   RN CM is available for assistance per new referral as needed.  Benjamine Mola "[name]" [name], RN-BSN  [facility] Care Management  Community Care Management Coordinator  [phone] [email or web address]

## 2018-10-17 DIAGNOSIS — M25561 Pain in right knee: Secondary | ICD-10-CM | POA: Diagnosis not present

## 2018-10-21 ENCOUNTER — Other Ambulatory Visit: Payer: Self-pay | Admitting: Cardiovascular Disease

## 2018-10-21 DIAGNOSIS — R2689 Other abnormalities of gait and mobility: Secondary | ICD-10-CM | POA: Diagnosis not present

## 2018-10-21 DIAGNOSIS — M6281 Muscle weakness (generalized): Secondary | ICD-10-CM | POA: Diagnosis not present

## 2018-10-23 DIAGNOSIS — M6281 Muscle weakness (generalized): Secondary | ICD-10-CM | POA: Diagnosis not present

## 2018-10-23 DIAGNOSIS — R2689 Other abnormalities of gait and mobility: Secondary | ICD-10-CM | POA: Diagnosis not present

## 2018-10-23 DIAGNOSIS — R293 Abnormal posture: Secondary | ICD-10-CM | POA: Diagnosis not present

## 2018-10-23 DIAGNOSIS — R278 Other lack of coordination: Secondary | ICD-10-CM | POA: Diagnosis not present

## 2018-10-24 DIAGNOSIS — R278 Other lack of coordination: Secondary | ICD-10-CM | POA: Diagnosis not present

## 2018-10-24 DIAGNOSIS — R293 Abnormal posture: Secondary | ICD-10-CM | POA: Diagnosis not present

## 2018-10-24 DIAGNOSIS — M6281 Muscle weakness (generalized): Secondary | ICD-10-CM | POA: Diagnosis not present

## 2018-10-28 DIAGNOSIS — M6281 Muscle weakness (generalized): Secondary | ICD-10-CM | POA: Diagnosis not present

## 2018-10-28 DIAGNOSIS — R293 Abnormal posture: Secondary | ICD-10-CM | POA: Diagnosis not present

## 2018-10-28 DIAGNOSIS — R2689 Other abnormalities of gait and mobility: Secondary | ICD-10-CM | POA: Diagnosis not present

## 2018-10-28 DIAGNOSIS — R278 Other lack of coordination: Secondary | ICD-10-CM | POA: Diagnosis not present

## 2018-10-30 DIAGNOSIS — R278 Other lack of coordination: Secondary | ICD-10-CM | POA: Diagnosis not present

## 2018-10-30 DIAGNOSIS — M6281 Muscle weakness (generalized): Secondary | ICD-10-CM | POA: Diagnosis not present

## 2018-10-30 DIAGNOSIS — R293 Abnormal posture: Secondary | ICD-10-CM | POA: Diagnosis not present

## 2018-10-30 DIAGNOSIS — R2689 Other abnormalities of gait and mobility: Secondary | ICD-10-CM | POA: Diagnosis not present

## 2018-10-31 DIAGNOSIS — R278 Other lack of coordination: Secondary | ICD-10-CM | POA: Diagnosis not present

## 2018-10-31 DIAGNOSIS — R293 Abnormal posture: Secondary | ICD-10-CM | POA: Diagnosis not present

## 2018-10-31 DIAGNOSIS — R2689 Other abnormalities of gait and mobility: Secondary | ICD-10-CM | POA: Diagnosis not present

## 2018-10-31 DIAGNOSIS — M6281 Muscle weakness (generalized): Secondary | ICD-10-CM | POA: Diagnosis not present

## 2018-11-04 DIAGNOSIS — R293 Abnormal posture: Secondary | ICD-10-CM | POA: Diagnosis not present

## 2018-11-04 DIAGNOSIS — G301 Alzheimer's disease with late onset: Secondary | ICD-10-CM | POA: Diagnosis not present

## 2018-11-04 DIAGNOSIS — M6281 Muscle weakness (generalized): Secondary | ICD-10-CM | POA: Diagnosis not present

## 2018-11-04 DIAGNOSIS — F0281 Dementia in other diseases classified elsewhere with behavioral disturbance: Secondary | ICD-10-CM | POA: Diagnosis not present

## 2018-11-04 DIAGNOSIS — R2689 Other abnormalities of gait and mobility: Secondary | ICD-10-CM | POA: Diagnosis not present

## 2018-11-04 DIAGNOSIS — I10 Essential (primary) hypertension: Secondary | ICD-10-CM | POA: Diagnosis not present

## 2018-11-04 DIAGNOSIS — R278 Other lack of coordination: Secondary | ICD-10-CM | POA: Diagnosis not present

## 2018-11-06 DIAGNOSIS — R2689 Other abnormalities of gait and mobility: Secondary | ICD-10-CM | POA: Diagnosis not present

## 2018-11-06 DIAGNOSIS — R278 Other lack of coordination: Secondary | ICD-10-CM | POA: Diagnosis not present

## 2018-11-06 DIAGNOSIS — R293 Abnormal posture: Secondary | ICD-10-CM | POA: Diagnosis not present

## 2018-11-06 DIAGNOSIS — M6281 Muscle weakness (generalized): Secondary | ICD-10-CM | POA: Diagnosis not present

## 2018-11-07 DIAGNOSIS — R293 Abnormal posture: Secondary | ICD-10-CM | POA: Diagnosis not present

## 2018-11-07 DIAGNOSIS — M6281 Muscle weakness (generalized): Secondary | ICD-10-CM | POA: Diagnosis not present

## 2018-11-07 DIAGNOSIS — R2689 Other abnormalities of gait and mobility: Secondary | ICD-10-CM | POA: Diagnosis not present

## 2018-11-07 DIAGNOSIS — R278 Other lack of coordination: Secondary | ICD-10-CM | POA: Diagnosis not present

## 2018-11-11 DIAGNOSIS — R293 Abnormal posture: Secondary | ICD-10-CM | POA: Diagnosis not present

## 2018-11-11 DIAGNOSIS — M6281 Muscle weakness (generalized): Secondary | ICD-10-CM | POA: Diagnosis not present

## 2018-11-11 DIAGNOSIS — R2689 Other abnormalities of gait and mobility: Secondary | ICD-10-CM | POA: Diagnosis not present

## 2018-11-11 DIAGNOSIS — G301 Alzheimer's disease with late onset: Secondary | ICD-10-CM | POA: Diagnosis not present

## 2018-11-11 DIAGNOSIS — I10 Essential (primary) hypertension: Secondary | ICD-10-CM | POA: Diagnosis not present

## 2018-11-11 DIAGNOSIS — R278 Other lack of coordination: Secondary | ICD-10-CM | POA: Diagnosis not present

## 2018-11-11 DIAGNOSIS — F0281 Dementia in other diseases classified elsewhere with behavioral disturbance: Secondary | ICD-10-CM | POA: Diagnosis not present

## 2018-11-12 DIAGNOSIS — R4189 Other symptoms and signs involving cognitive functions and awareness: Secondary | ICD-10-CM | POA: Diagnosis not present

## 2018-11-12 DIAGNOSIS — F333 Major depressive disorder, recurrent, severe with psychotic symptoms: Secondary | ICD-10-CM | POA: Diagnosis not present

## 2018-11-12 DIAGNOSIS — F419 Anxiety disorder, unspecified: Secondary | ICD-10-CM | POA: Diagnosis not present

## 2018-11-12 DIAGNOSIS — F4324 Adjustment disorder with disturbance of conduct: Secondary | ICD-10-CM | POA: Diagnosis not present

## 2018-11-13 DIAGNOSIS — R2689 Other abnormalities of gait and mobility: Secondary | ICD-10-CM | POA: Diagnosis not present

## 2018-11-13 DIAGNOSIS — M6281 Muscle weakness (generalized): Secondary | ICD-10-CM | POA: Diagnosis not present

## 2018-11-13 DIAGNOSIS — R293 Abnormal posture: Secondary | ICD-10-CM | POA: Diagnosis not present

## 2018-11-13 DIAGNOSIS — R278 Other lack of coordination: Secondary | ICD-10-CM | POA: Diagnosis not present

## 2018-11-14 DIAGNOSIS — R278 Other lack of coordination: Secondary | ICD-10-CM | POA: Diagnosis not present

## 2018-11-14 DIAGNOSIS — R293 Abnormal posture: Secondary | ICD-10-CM | POA: Diagnosis not present

## 2018-11-14 DIAGNOSIS — M6281 Muscle weakness (generalized): Secondary | ICD-10-CM | POA: Diagnosis not present

## 2018-11-15 DIAGNOSIS — R2689 Other abnormalities of gait and mobility: Secondary | ICD-10-CM | POA: Diagnosis not present

## 2018-11-15 DIAGNOSIS — M6281 Muscle weakness (generalized): Secondary | ICD-10-CM | POA: Diagnosis not present

## 2018-11-18 DIAGNOSIS — M6281 Muscle weakness (generalized): Secondary | ICD-10-CM | POA: Diagnosis not present

## 2018-11-18 DIAGNOSIS — R278 Other lack of coordination: Secondary | ICD-10-CM | POA: Diagnosis not present

## 2018-11-18 DIAGNOSIS — R293 Abnormal posture: Secondary | ICD-10-CM | POA: Diagnosis not present

## 2018-11-18 DIAGNOSIS — R2689 Other abnormalities of gait and mobility: Secondary | ICD-10-CM | POA: Diagnosis not present

## 2018-11-20 DIAGNOSIS — M6281 Muscle weakness (generalized): Secondary | ICD-10-CM | POA: Diagnosis not present

## 2018-11-20 DIAGNOSIS — R278 Other lack of coordination: Secondary | ICD-10-CM | POA: Diagnosis not present

## 2018-11-20 DIAGNOSIS — R293 Abnormal posture: Secondary | ICD-10-CM | POA: Diagnosis not present

## 2018-11-20 DIAGNOSIS — R2689 Other abnormalities of gait and mobility: Secondary | ICD-10-CM | POA: Diagnosis not present

## 2018-11-27 ENCOUNTER — Emergency Department (HOSPITAL_COMMUNITY): Payer: Medicare Other

## 2018-11-27 ENCOUNTER — Encounter (HOSPITAL_COMMUNITY): Payer: Self-pay | Admitting: Emergency Medicine

## 2018-11-27 ENCOUNTER — Inpatient Hospital Stay (HOSPITAL_COMMUNITY)
Admission: EM | Admit: 2018-11-27 | Discharge: 2018-12-21 | DRG: 177 | Disposition: E | Payer: Medicare Other | Attending: Internal Medicine | Admitting: Internal Medicine

## 2018-11-27 ENCOUNTER — Other Ambulatory Visit: Payer: Self-pay

## 2018-11-27 DIAGNOSIS — N179 Acute kidney failure, unspecified: Secondary | ICD-10-CM | POA: Diagnosis present

## 2018-11-27 DIAGNOSIS — G92 Toxic encephalopathy: Secondary | ICD-10-CM | POA: Diagnosis present

## 2018-11-27 DIAGNOSIS — R0902 Hypoxemia: Secondary | ICD-10-CM | POA: Diagnosis not present

## 2018-11-27 DIAGNOSIS — E872 Acidosis: Secondary | ICD-10-CM | POA: Diagnosis present

## 2018-11-27 DIAGNOSIS — R918 Other nonspecific abnormal finding of lung field: Secondary | ICD-10-CM | POA: Diagnosis not present

## 2018-11-27 DIAGNOSIS — Z66 Do not resuscitate: Secondary | ICD-10-CM | POA: Diagnosis present

## 2018-11-27 DIAGNOSIS — I4891 Unspecified atrial fibrillation: Secondary | ICD-10-CM | POA: Diagnosis not present

## 2018-11-27 DIAGNOSIS — Z515 Encounter for palliative care: Secondary | ICD-10-CM | POA: Diagnosis not present

## 2018-11-27 DIAGNOSIS — R4689 Other symptoms and signs involving appearance and behavior: Secondary | ICD-10-CM | POA: Diagnosis not present

## 2018-11-27 DIAGNOSIS — R001 Bradycardia, unspecified: Secondary | ICD-10-CM | POA: Diagnosis not present

## 2018-11-27 DIAGNOSIS — J189 Pneumonia, unspecified organism: Secondary | ICD-10-CM | POA: Diagnosis not present

## 2018-11-27 DIAGNOSIS — N471 Phimosis: Secondary | ICD-10-CM | POA: Diagnosis present

## 2018-11-27 DIAGNOSIS — J1289 Other viral pneumonia: Secondary | ICD-10-CM | POA: Diagnosis present

## 2018-11-27 DIAGNOSIS — Z20828 Contact with and (suspected) exposure to other viral communicable diseases: Secondary | ICD-10-CM | POA: Diagnosis present

## 2018-11-27 DIAGNOSIS — I129 Hypertensive chronic kidney disease with stage 1 through stage 4 chronic kidney disease, or unspecified chronic kidney disease: Secondary | ICD-10-CM | POA: Diagnosis present

## 2018-11-27 DIAGNOSIS — I10 Essential (primary) hypertension: Secondary | ICD-10-CM | POA: Diagnosis not present

## 2018-11-27 DIAGNOSIS — I447 Left bundle-branch block, unspecified: Secondary | ICD-10-CM | POA: Diagnosis present

## 2018-11-27 DIAGNOSIS — R338 Other retention of urine: Secondary | ICD-10-CM | POA: Diagnosis not present

## 2018-11-27 DIAGNOSIS — I4821 Permanent atrial fibrillation: Secondary | ICD-10-CM | POA: Diagnosis present

## 2018-11-27 DIAGNOSIS — R7989 Other specified abnormal findings of blood chemistry: Secondary | ICD-10-CM | POA: Diagnosis present

## 2018-11-27 DIAGNOSIS — E860 Dehydration: Secondary | ICD-10-CM | POA: Diagnosis present

## 2018-11-27 DIAGNOSIS — F0391 Unspecified dementia with behavioral disturbance: Secondary | ICD-10-CM | POA: Diagnosis present

## 2018-11-27 DIAGNOSIS — J069 Acute upper respiratory infection, unspecified: Secondary | ICD-10-CM | POA: Diagnosis not present

## 2018-11-27 DIAGNOSIS — J9601 Acute respiratory failure with hypoxia: Secondary | ICD-10-CM | POA: Diagnosis present

## 2018-11-27 DIAGNOSIS — U071 COVID-19: Principal | ICD-10-CM | POA: Diagnosis present

## 2018-11-27 DIAGNOSIS — D631 Anemia in chronic kidney disease: Secondary | ICD-10-CM | POA: Diagnosis present

## 2018-11-27 DIAGNOSIS — Z79899 Other long term (current) drug therapy: Secondary | ICD-10-CM

## 2018-11-27 DIAGNOSIS — N189 Chronic kidney disease, unspecified: Secondary | ICD-10-CM

## 2018-11-27 DIAGNOSIS — R404 Transient alteration of awareness: Secondary | ICD-10-CM | POA: Diagnosis not present

## 2018-11-27 DIAGNOSIS — R339 Retention of urine, unspecified: Secondary | ICD-10-CM | POA: Diagnosis not present

## 2018-11-27 DIAGNOSIS — Z87891 Personal history of nicotine dependence: Secondary | ICD-10-CM

## 2018-11-27 DIAGNOSIS — Z209 Contact with and (suspected) exposure to unspecified communicable disease: Secondary | ICD-10-CM | POA: Diagnosis not present

## 2018-11-27 DIAGNOSIS — N184 Chronic kidney disease, stage 4 (severe): Secondary | ICD-10-CM | POA: Diagnosis present

## 2018-11-27 HISTORY — DX: Cellulitis of left orbit: H05.012

## 2018-11-27 HISTORY — DX: Essential (primary) hypertension: I10

## 2018-11-27 HISTORY — DX: Violent behavior: R45.6

## 2018-11-27 HISTORY — DX: Primary spontaneous pneumothorax: J93.11

## 2018-11-27 LAB — URINALYSIS, ROUTINE W REFLEX MICROSCOPIC
Bilirubin Urine: NEGATIVE
Glucose, UA: NEGATIVE mg/dL
Hgb urine dipstick: NEGATIVE
Ketones, ur: NEGATIVE mg/dL
Leukocytes,Ua: NEGATIVE
Nitrite: NEGATIVE
Protein, ur: 30 mg/dL — AB
Specific Gravity, Urine: 1.013 (ref 1.005–1.030)
pH: 6 (ref 5.0–8.0)

## 2018-11-27 LAB — COMPREHENSIVE METABOLIC PANEL
ALT: 32 U/L (ref 0–44)
AST: 45 U/L — ABNORMAL HIGH (ref 15–41)
Albumin: 3 g/dL — ABNORMAL LOW (ref 3.5–5.0)
Alkaline Phosphatase: 90 U/L (ref 38–126)
Anion gap: 13 (ref 5–15)
BUN: 58 mg/dL — ABNORMAL HIGH (ref 8–23)
CO2: 20 mmol/L — ABNORMAL LOW (ref 22–32)
Calcium: 8 mg/dL — ABNORMAL LOW (ref 8.9–10.3)
Chloride: 105 mmol/L (ref 98–111)
Creatinine, Ser: 2.41 mg/dL — ABNORMAL HIGH (ref 0.61–1.24)
GFR calc Af Amer: 26 mL/min — ABNORMAL LOW (ref >60)
GFR calc non Af Amer: 22 mL/min — ABNORMAL LOW (ref >60)
Glucose, Bld: 132 mg/dL — ABNORMAL HIGH (ref 70–99)
Potassium: 3.9 mmol/L (ref 3.5–5.1)
Sodium: 138 mmol/L (ref 135–145)
Total Bilirubin: 0.9 mg/dL (ref 0.3–1.2)
Total Protein: 6 g/dL — ABNORMAL LOW (ref 6.5–8.1)

## 2018-11-27 LAB — MRSA PCR SCREENING: MRSA by PCR: NEGATIVE

## 2018-11-27 LAB — CBC
HCT: 29.3 % — ABNORMAL LOW (ref 39.0–52.0)
Hemoglobin: 9.7 g/dL — ABNORMAL LOW (ref 13.0–17.0)
MCH: 31.4 pg (ref 26.0–34.0)
MCHC: 33.1 g/dL (ref 30.0–36.0)
MCV: 94.8 fL (ref 80.0–100.0)
Platelets: 179 10*3/uL (ref 150–400)
RBC: 3.09 MIL/uL — ABNORMAL LOW (ref 4.22–5.81)
RDW: 14 % (ref 11.5–15.5)
WBC: 6.2 10*3/uL (ref 4.0–10.5)
nRBC: 0 % (ref 0.0–0.2)

## 2018-11-27 LAB — SEDIMENTATION RATE: Sed Rate: 50 mm/hr — ABNORMAL HIGH (ref 0–16)

## 2018-11-27 LAB — PROCALCITONIN: Procalcitonin: 0.22 ng/mL

## 2018-11-27 LAB — FERRITIN: Ferritin: 618 ng/mL — ABNORMAL HIGH (ref 24–336)

## 2018-11-27 LAB — ABO/RH: ABO/RH(D): O POS

## 2018-11-27 LAB — LACTIC ACID, PLASMA
Lactic Acid, Venous: 3.1 mmol/L (ref 0.5–1.9)
Lactic Acid, Venous: 2.5 mmol/L (ref 0.5–1.9)

## 2018-11-27 LAB — FIBRINOGEN: Fibrinogen: 486 mg/dL — ABNORMAL HIGH (ref 210–475)

## 2018-11-27 LAB — SARS CORONAVIRUS 2 BY RT PCR (HOSPITAL ORDER, PERFORMED IN ~~LOC~~ HOSPITAL LAB): SARS Coronavirus 2: POSITIVE — AB

## 2018-11-27 LAB — APTT: aPTT: 34 s (ref 24–36)

## 2018-11-27 LAB — D-DIMER, QUANTITATIVE: D-Dimer, Quant: 2.95 ug/mL-FEU — ABNORMAL HIGH (ref 0.00–0.50)

## 2018-11-27 LAB — LACTATE DEHYDROGENASE: LDH: 282 U/L — ABNORMAL HIGH (ref 98–192)

## 2018-11-27 LAB — BRAIN NATRIURETIC PEPTIDE: B Natriuretic Peptide: 122.3 pg/mL — ABNORMAL HIGH (ref 0.0–100.0)

## 2018-11-27 LAB — C-REACTIVE PROTEIN: CRP: 16.5 mg/dL — ABNORMAL HIGH (ref <1.0)

## 2018-11-27 LAB — PROTIME-INR
INR: 1.3 — ABNORMAL HIGH (ref 0.8–1.2)
Prothrombin Time: 16.3 s — ABNORMAL HIGH (ref 11.4–15.2)

## 2018-11-27 LAB — TROPONIN I (HIGH SENSITIVITY)
Troponin I (High Sensitivity): 55 ng/L — ABNORMAL HIGH (ref <18)
Troponin I (High Sensitivity): 55 ng/L — ABNORMAL HIGH

## 2018-11-27 MED ORDER — RISPERIDONE 1 MG/ML PO SOLN
1.0000 mg | Freq: Every day | ORAL | Status: DC
  Administered 2018-11-28: 1 mg via ORAL
  Filled 2018-11-27 (×2): qty 1

## 2018-11-27 MED ORDER — VITAMIN C 500 MG PO TABS
500.0000 mg | ORAL_TABLET | Freq: Every day | ORAL | Status: DC
  Administered 2018-11-28: 500 mg via ORAL
  Filled 2018-11-27: qty 1

## 2018-11-27 MED ORDER — SODIUM CHLORIDE 0.9 % IV SOLN
200.0000 mg | Freq: Once | INTRAVENOUS | Status: AC
  Administered 2018-11-28: 200 mg via INTRAVENOUS
  Filled 2018-11-27: qty 40

## 2018-11-27 MED ORDER — HYDROCOD POLST-CPM POLST ER 10-8 MG/5ML PO SUER: 5.0000 mL | Freq: Two times a day (BID) | ORAL | Status: DC | PRN

## 2018-11-27 MED ORDER — METHYLPREDNISOLONE SODIUM SUCC 40 MG IJ SOLR
40.0000 mg | Freq: Two times a day (BID) | INTRAMUSCULAR | Status: DC
  Administered 2018-11-27 – 2018-11-29 (×4): 40 mg via INTRAVENOUS
  Filled 2018-11-27 (×4): qty 1

## 2018-11-27 MED ORDER — SODIUM CHLORIDE 0.9 % IV SOLN
100.0000 mg | INTRAVENOUS | Status: DC
  Administered 2018-11-28: 23:00:00 100 mg via INTRAVENOUS
  Filled 2018-11-27 (×2): qty 20

## 2018-11-27 MED ORDER — SODIUM CHLORIDE 0.9% FLUSH
3.0000 mL | Freq: Two times a day (BID) | INTRAVENOUS | Status: DC
  Administered 2018-11-27 – 2018-11-29 (×4): 3 mL via INTRAVENOUS

## 2018-11-27 MED ORDER — LACTATED RINGERS IV BOLUS
1000.0000 mL | Freq: Once | INTRAVENOUS | Status: AC
  Administered 2018-11-27: 1000 mL via INTRAVENOUS

## 2018-11-27 MED ORDER — ALBUTEROL SULFATE HFA 108 (90 BASE) MCG/ACT IN AERS
2.0000 | INHALATION_SPRAY | Freq: Four times a day (QID) | RESPIRATORY_TRACT | Status: DC
  Administered 2018-11-28 – 2018-11-29 (×6): 2 via RESPIRATORY_TRACT
  Filled 2018-11-27 (×2): qty 6.7

## 2018-11-27 MED ORDER — ZINC SULFATE 220 (50 ZN) MG PO CAPS
220.0000 mg | ORAL_CAPSULE | Freq: Every day | ORAL | Status: DC
  Administered 2018-11-28: 220 mg via ORAL
  Filled 2018-11-27: qty 1

## 2018-11-27 MED ORDER — AMLODIPINE BESYLATE 5 MG PO TABS
5.0000 mg | ORAL_TABLET | Freq: Every day | ORAL | Status: DC
  Administered 2018-11-28: 5 mg via ORAL
  Filled 2018-11-27: qty 1

## 2018-11-27 MED ORDER — LACTATED RINGERS IV BOLUS
1000.0000 mL | Freq: Once | INTRAVENOUS | Status: AC
  Administered 2018-11-27: 19:00:00 1000 mL via INTRAVENOUS

## 2018-11-27 MED ORDER — ACETAMINOPHEN 325 MG PO TABS: 650.0000 mg | ORAL_TABLET | Freq: Four times a day (QID) | ORAL | Status: DC | PRN

## 2018-11-27 MED ORDER — GUAIFENESIN-DM 100-10 MG/5ML PO SYRP
10.0000 mL | ORAL_SOLUTION | ORAL | Status: DC | PRN
  Administered 2018-11-27: 10 mL via ORAL
  Filled 2018-11-27: qty 10

## 2018-11-27 MED ORDER — VALPROIC ACID 250 MG/5ML PO SOLN
250.0000 mg | Freq: Two times a day (BID) | ORAL | Status: DC
  Administered 2018-11-27 – 2018-11-28 (×3): 250 mg via ORAL
  Filled 2018-11-27 (×6): qty 5

## 2018-11-27 MED ORDER — HEPARIN SODIUM (PORCINE) 5000 UNIT/ML IJ SOLN
5000.0000 [IU] | Freq: Three times a day (TID) | INTRAMUSCULAR | Status: DC
  Administered 2018-11-27 – 2018-11-29 (×6): 5000 [IU] via SUBCUTANEOUS
  Filled 2018-11-27 (×6): qty 1

## 2018-11-27 NOTE — ED Notes (Addendum)
Pt has large area of purple ecchymosis from middle of back to lower back that was present upon admission

## 2018-11-27 NOTE — ED Notes (Addendum)
MD Mesner aware pt is COVID +.

## 2018-11-27 NOTE — Progress Notes (Signed)
MEDICATION RELATED CONSULT NOTE - INITIAL   Pharmacy Consult for remdesivir Indication: COVID 19  Allergies  Allergen Reactions  . Coreg [Carvedilol] Other (See Comments)    Weakness     Patient Measurements: Height: 5\' 6"  (167.6 cm) Weight: 120 lb (54.4 kg) IBW/kg (Calculated) : 63.8   Vital Signs: Temp: 98.4 F (36.9 C) (07/08 2259) Temp Source: Oral (07/08 2259) BP: 135/66 (07/08 2259) Pulse Rate: 61 (07/08 2259) Intake/Output from previous day: No intake/output data recorded. Intake/Output from this shift: No intake/output data recorded.  Labs: Recent Labs    11/21/2018 1600 11/30/2018 1655  WBC 6.2  --   HGB 9.7*  --   HCT 29.3*  --   PLT 179  --   APTT  --  34  CREATININE 2.41*  --   ALBUMIN 3.0*  --   PROT 6.0*  --   AST 45*  --   ALT 32  --   ALKPHOS 90  --   BILITOT 0.9  --    Estimated Creatinine Clearance: 14.7 mL/min (A) (by C-G formula based on SCr of 2.41 mg/dL (H)).   Microbiology: Recent Results (from the past 720 hour(s))  SARS Coronavirus 2 (CEPHEID- Performed in [place] hospital lab), Hosp Order     Status: Abnormal   Collection Time: 11/21/2018  4:00 PM   Specimen: Nasopharyngeal Swab  Result Value Ref Range Status   SARS Coronavirus 2 POSITIVE (A) NEGATIVE Final    Comment: RESULT CALLED TO, READ BACK BY AND VERIFIED WITH: K CARTER RN 12/07/2018 1745 JDW (NOTE) If result is NEGATIVE SARS-CoV-2 target nucleic acids are NOT DETECTED. The SARS-CoV-2 RNA is generally detectable in upper and lower  respiratory specimens during the acute phase of infection. The lowest  concentration of SARS-CoV-2 viral copies this assay can detect is 250  copies / mL. A negative result does not preclude SARS-CoV-2 infection  and should not be used as the sole basis for treatment or other  patient management decisions.  A negative result may occur with  improper specimen collection / handling, submission of specimen other  than nasopharyngeal swab, presence  of viral mutation(s) within the  areas targeted by this assay, and inadequate number of viral copies  (<250 copies / mL). A negative result must be combined with clinical  observations, patient history, and epidemiological information. If result is POSITIVE SARS-CoV-2 target nucleic acids are DETECTED. The SARS- CoV-2 RNA is generally detectable in upper and lower  respiratory specimens during the acute phase of infection.  Positive  results are indicative of active infection with SARS-CoV-2.  Clinical  correlation with patient history and other diagnostic information is  necessary to determine patient infection status.  Positive results do  not rule out bacterial infection or co-infection with other viruses. If result is PRESUMPTIVE POSTIVE SARS-CoV-2 nucleic acids MAY BE PRESENT.   A presumptive positive result was obtained on the submitted specimen  and confirmed on repeat testing.  While 2019 novel coronavirus  (SARS-CoV-2) nucleic acids may be present in the submitted sample  additional confirmatory testing may be necessary for epidemiological  and / or clinical management purposes  to differentiate between  SARS-CoV-2 and other Sarbecovirus currently known to infect humans.  If clinically indicated additional testing with an alternate test  methodology [phone]) is advised . The SARS-CoV-2 RNA is generally  detectable in upper and lower respiratory specimens during the acute  phase of infection. The expected result is Negative. Fact Sheet for Patients:  StrictlyIdeas.no Fact Sheet for Healthcare Providers: BankingDealers.co.za This test is not yet approved or cleared by the [place] FDA and has been authorized for detection and/or diagnosis of SARS-CoV-2 by FDA under an Emergency Use Authorization (EUA).  This EUA will remain in effect (meaning this test can be used) for the duration of the COVID-19 declaration under Section  564(b)(1) of the Act, 21 U.S.C. section 360bbb-3(b)(1), unless the authorization is terminated or revoked sooner. Performed at [facility], [place] [address], [place], [place] [address]   MRSA PCR Screening     Status: None   Collection Time: 12/11/2018  9:42 PM   Specimen: Nasopharyngeal  Result Value Ref Range Status   MRSA by PCR NEGATIVE NEGATIVE Final    Comment:        The GeneXpert MRSA Assay (FDA approved for NASAL specimens only), is one component of a comprehensive MRSA colonization surveillance program. It is not intended to diagnose MRSA infection nor to guide or monitor treatment for MRSA infections. Performed at [facility], [place] [address] [place], [place] [address]     Medical History: Past Medical History:  Diagnosis Date  . Cellulitis of left orbit   . Hypertension   . LBBB (left bundle branch block)   . Primary spontaneous pneumothorax   . Violent behavior     Assessment: Patient deemed appropriate for remdesivir therapy  Plan:  Remdesivir 200 mg IV x 1 then 100 mg q24 hours  Jann Ra Poteet 12/13/2018,11:19 PM

## 2018-11-27 NOTE — H&P (Signed)
History and Physical    Lawrence Hicks TIW:[id number] DOB: 1923/06/12 DOA: 11/30/2018  PCP: Patient, No Pcp Per  Patient coming from: [facility]  I have personally briefly reviewed patient's old medical records in [facility]  Chief Complaint: Shortness of breath, fatigue  HPI: Lawrence Hicks is a 85 y.o. male with medical history significant for hypertension, LBBB, CKD stage III/IV, and suspected underlying dementia with behavioral disorder who presents to the ED from [facility] for evaluation of poor appetite, fatigue, and shortness of breath.  History is limited from patient therefore entirety of history is obtained from EDP and chart review.  Per documentation, EMS were called to his facility and he was noted to have oxygen saturation in the high 80s.  He was placed on 6 L supplemental O2 with improvement and weaned down to 2 L on arrival to the ED.  Of note patient had a recently prolonged ED stay from 09/21/2018-10/11/2018 for confusion and memory loss prior to discharge to [facility].  He had a SARS-CoV-2 test negative on 09/28/2018.  ED Course:  Initial vitals showed BP 119/70, pulse 72, RR 19, temp 98.4 Fahrenheit, SPO2 94% on 4 L supplemental O2 via [place].  Labs are notable for WBC 6.2, hemoglobin 9.7, platelets 179,000, BUN 58, creatinine 2.41, potassium 3.9, AST 45, ALT 32, alk phos 90, T bili 0.9, lactic acid 3.1 >> 2.5, Troponin I 55 >> 55.  Blood cultures were drawn and pending.  SARS-CoV-2 test was positive.  Portable chest x-ray showed patchy bibasilar infiltrates.  Patient was given 2 L LR and the hospitalist service was consulted for further evaluation and management.  Review of Systems:  Unable to obtain full review of systems due to suspected underlying dementia.   Past Medical History:  Diagnosis Date  . Cellulitis of left orbit   . Hypertension   . LBBB (left bundle branch block)   . Primary spontaneous pneumothorax   . Violent behavior      Past Surgical History:  Procedure Laterality Date  . CYST EXCISION     85 yrs old    Social History:  reports that he quit smoking about 63 years ago. He has never used smokeless tobacco. No history on file for alcohol and drug.  Allergies  Allergen Reactions  . Coreg [Carvedilol] Other (See Comments)    Weakness     Family History  Family history unknown: Yes     Prior to Admission medications   Medication Sig Start Date End Date Taking? Authorizing Provider  amLODipine (NORVASC) 5 MG tablet Take 5 mg by mouth daily.   Yes [provider]  ramipril (ALTACE) 2.5 MG capsule TAKE 1 CAPSULE BY MOUTH EVERY DAY Patient taking differently: Take 2.5 mg by mouth daily.  10/21/18  Yes Nahser, Wonda Cheng, MD  risperiDONE (RISPERDAL) 1 MG/ML oral solution Take 1 mg by mouth daily.   Yes [provider]  valproic acid (DEPAKENE) 250 MG/5ML SOLN solution Take 250 mg by mouth 2 (two) times a day.   Yes [provider]    Physical Exam: Vitals:   12/14/2018 1854 12/14/2018 1901 12/16/2018 1915 12/08/2018 1945  BP:  (!) 143/90 126/74 128/74  Pulse:  87 65 67  Resp:  (!) 25 (!) 21 (!) 24  Temp:      TempSrc:      SpO2: 93%  91% 94%  Weight:      Height:        Constitutional:  Elderly man resting in the left lateral decubitus position in bed, NAD, calm, comfortable Eyes: PERRL, lids and conjunctivae normal ENMT: Mucous membranes are dry. Posterior pharynx clear of any exudate or lesions.Normal dentition.  Neck: normal, supple, no masses. Respiratory: Normal respiratory effort. No accessory muscle use.  On 4 L supplemental O2 via [place]. Cardiovascular: Irregularly irregular. No extremity edema.  Abdomen: no tenderness, no masses palpated. No hepatosplenomegaly.  Musculoskeletal: no clubbing / cyanosis. No joint deformity upper and lower extremities.   Skin: no rashes, lesions, ulcers. Neurologic: Limited due to patient cooperation, moving all extremities  spontaneously, sensation appears intact. Psychiatric: Alert and awake but only oriented to self.  Behavior is currently appropriate.    Labs on Admission: I have personally reviewed following labs and imaging studies  CBC: Recent Labs  Lab 12/20/2018 1600  WBC 6.2  HGB 9.7*  HCT 29.3*  MCV 94.8  PLT 161   Basic Metabolic Panel: Recent Labs  Lab 12/07/2018 1600  NA 138  K 3.9  CL 105  CO2 20*  GLUCOSE 132*  BUN 58*  CREATININE 2.41*  CALCIUM 8.0*   GFR: Estimated Creatinine Clearance: 14.7 mL/min (A) (by C-G formula based on SCr of 2.41 mg/dL (H)). Liver Function Tests: Recent Labs  Lab 12/15/2018 1600  AST 45*  ALT 32  ALKPHOS 90  BILITOT 0.9  PROT 6.0*  ALBUMIN 3.0*   No results for input(s): LIPASE, AMYLASE in the last 168 hours. No results for input(s): AMMONIA in the last 168 hours. Coagulation Profile: Recent Labs  Lab 11/26/2018 1655  INR 1.3*   Cardiac Enzymes: No results for input(s): CKTOTAL, CKMB, CKMBINDEX, TROPONINI in the last 168 hours. BNP (last 3 results) No results for input(s): PROBNP in the last 8760 hours. HbA1C: No results for input(s): HGBA1C in the last 72 hours. CBG: No results for input(s): GLUCAP in the last 168 hours. Lipid Profile: No results for input(s): CHOL, HDL, LDLCALC, TRIG, CHOLHDL, LDLDIRECT in the last 72 hours. Thyroid Function Tests: No results for input(s): TSH, T4TOTAL, FREET4, T3FREE, THYROIDAB in the last 72 hours. Anemia Panel: No results for input(s): VITAMINB12, FOLATE, FERRITIN, TIBC, IRON, RETICCTPCT in the last 72 hours. Urine analysis:    Component Value Date/Time   COLORURINE YELLOW 12/20/2018 1650   APPEARANCEUR CLEAR 12/06/2018 1650   LABSPEC 1.013 11/24/2018 1650   PHURINE 6.0 11/25/2018 1650   GLUCOSEU NEGATIVE 11/20/2018 1650   HGBUR NEGATIVE 11/21/2018 1650   BILIRUBINUR NEGATIVE 12/15/2018 1650   KETONESUR NEGATIVE 11/28/2018 1650   PROTEINUR 30 (A) 12/16/2018 1650   NITRITE NEGATIVE  12/07/2018 1650   LEUKOCYTESUR NEGATIVE 12/04/2018 1650    Radiological Exams on Admission: Dg Chest Port 1 View  Result Date: 12/12/2018 CLINICAL DATA:  Pneumonia EXAM: PORTABLE CHEST 1 VIEW COMPARISON:  Portable exam at 1646 hrs compared to 09/21/2018 FINDINGS: Kyphotic positioning. Upper normal heart size. Mediastinal contours and pulmonary vascularity normal. Patchy infiltrates in the mid to lower lungs bilaterally question pneumonia. Component of atelectasis at LEFT base is also present. No pleural effusion or pneumothorax. Extensive degenerative changes of the LEFT glenohumeral joint with osseous demineralization noted. IMPRESSION: Patchy bibasilar infiltrates consistent with pneumonia. LEFT basilar atelectasis. Electronically Signed   By: Lavonia Dana M.D.   On: 11/22/2018 17:23    EKG: Independently reviewed.  Atrial fibrillation with LBBB, atrial fibrillation is new when compared to prior.  Assessment/Plan Principal Problem:   Acute respiratory disease due to COVID-19 virus Active Problems:   HTN (hypertension)  Aggressive behavior of adult   Acute-on-chronic kidney injury ([place])  Lawrence Hicks is a 85 y.o. male with medical history significant for hypertension, LBBB, CKD stage III/IV, and suspected underlying dementia with behavioral disorder who is admitted with acute hypoxic respiratory failure and pneumonia due to COVID-19.  Acute respiratory failure with hypoxia and viral pneumonia due to COVID-19 infection: Labs notable for CRP 16.5, ferritin 618, fibrinogen 486, ESR 50, LDH 282, d-dimer 2.95, BNP 122.3, procalcitonin 0.22. -Start IV Solu-Medrol 40 mg twice daily -Start Remdesivir per pharmacy -Continue supplemental oxygen as needed -Continue albuterol, antitussives, vitamin C, zinc -Daily labs ordered -Keep at [facility] overnight due to mild troponin elevation, can likely transfer to [facility] if stabilizes  Acute on chronic kidney injury: Likely prerenal from poor oral  intake.  Given 2 L fluids in the ED.  We will hold home ramipril and recheck labs in a.m.  Atrial fibrillation: Appears to be new compared to prior EKGs.  LBBB is present and known from prior.  ChadsVasc score is at least 3 with 3.2% yearly stroke risk, however patient unlikely to be a candidate for long-term anticoagulation.  Elevated troponin: Mild elevation, suspect from demand ischemia due to respiratory failure and viral infection.  Anemia: Hemoglobin 9.7 on admission.  No obvious bleeding.  Will continue to monitor and transfuse as needed.  Hypertension: Continue home amlodipine, holding ramipril as above due to AKI.  Behavioral disorder: Suspect underlying dementia.  Continue home Risperdal and valproic acid.   DVT prophylaxis: Subcutaneous heparin Code Status: Full code, based on ALF documentation Family Communication: None available on admission Disposition Plan: Pending clinical progress Consults called: None Admission status: Admit - It is my clinical opinion that admission to INPATIENT is reasonable and necessary because of the expectation that this patient will require hospital care that crosses at least 2 midnights to treat this condition based on the medical complexity of the problems presented.  Given the aforementioned information, the predictability of an adverse outcome is felt to be significant.      Zada Finders MD [facility]  If 7PM-7AM, please contact night-coverage [email or web address]  12/17/2018, 8:24 PM

## 2018-11-27 NOTE — ED Triage Notes (Addendum)
Pt from [facility] [place], facility reports decreased appetite and pt not acting himself since yesterday. Covid cases are present at facility. Pt oriented x 4 with EMS, has to be somewhat aroused to answer.  EKG showed Afib. Pt satting high 80's when EMS arrived, placed on 6L [place]-does not wear oxygen at baseline. Pt then weaned to 2L at 98%. Pt reports generalized pain.

## 2018-11-27 NOTE — ED Notes (Signed)
MD Patel at bedside.

## 2018-11-27 NOTE — ED Notes (Addendum)
This RN inquired to MD Mesner about starting abx for septic bundle, will wait for further orders.

## 2018-11-27 NOTE — ED Notes (Signed)
Notified Dr. Dayna Barker of pt's lactic acid.

## 2018-11-27 NOTE — ED Provider Notes (Signed)
Emergency Department Provider Note   I have reviewed the triage vital signs and the nursing notes.   HISTORY  Chief Complaint Fatigue   HPI Lawrence Hicks is a 85 y.o. male with medical history as documented below who presents the emergency department today from a facility secondary to fatigue.  Soundly the facility has multiple episodes of coronavirus and patient here with decreased appetite, fatigue, hypoxia with EMS.  Patient's memory seems impaired but he denies having any GI symptoms, urinary symptoms or rashes.  Does have little bit of cough and shortness of breath.  States it seems to be better with oxygen.  No other associated or modifying symptoms.    Past Medical History:  Diagnosis Date  . Cellulitis of left orbit   . Hypertension   . LBBB (left bundle branch block)   . Primary spontaneous pneumothorax   . Violent behavior     Patient Active Problem List   Diagnosis Date Noted  . Evaluation by psychiatric service required   . Aggressive behavior of adult 09/22/2018  . Decreased breath sounds in left mid-lung field 09/25/2016  . Screening for hyperlipidemia 09/25/2016  . Nasal congestion 02/07/2016  . Cellulitis 08/10/2015  . Spontaneous pneumothorax 07/24/2015  . HTN (hypertension) 12/17/2012    Past Surgical History:  Procedure Laterality Date  . CYST EXCISION     85 yrs old    Current Outpatient Rx  . Order #: [id number] Class: Normal  . Order #: [id number] Class: Normal    Allergies Coreg [carvedilol]  Family History  Family history unknown: Yes    Social History Social History   Tobacco Use  . Smoking status: Former Smoker    Quit date: 08/10/1955    Years since quitting: 63.3  . Smokeless tobacco: Never Used  . Tobacco comment: 50 yrs ago  Substance Use Topics  . Alcohol use: Not on file  . Drug use: Not on file    Review of Systems  All other systems negative except as documented in the HPI. All pertinent positives and  negatives as reviewed in the HPI. ____________________________________________   PHYSICAL EXAM:  VITAL SIGNS: ED Triage Vitals   Vitals:   2018-12-06 1200 12-06-2018 1300 06-Dec-2018 1400 December 06, 2018 1830  BP: (!) 80/43     Pulse: 61 (!) 101 71   Resp:      Temp:      TempSrc:      SpO2: (!) 88% 91% (!) 62%   Weight:    54.4 kg  Height:    5\' 6"  (1.676 m)     Constitutional: Alert and oriented. Well appearing and in no acute distress. Eyes: Conjunctivae are normal. PERRL. EOMI. Head: Atraumatic. Nose: No congestion/rhinnorhea. Mouth/Throat: Mucous membranes are moist.  Oropharynx non-erythematous. Neck: No stridor.  No meningeal signs.   Cardiovascular: Normal rate, regular rhythm. Good peripheral circulation. Grossly normal heart sounds.   Respiratory: Tachypneic respiratory effort.  mild retractions. right lung base crackles.  Reportedly hypoxic on room air. Gastrointestinal: Soft and nontender. No distention.  Musculoskeletal: No lower extremity tenderness nor edema. No gross deformities of extremities. Neurologic:  Normal speech and language. No gross focal neurologic deficits are appreciated.  Skin:  Skin is warm, dry and intact. No rash noted.   ____________________________________________   LABS (all labs ordered are listed, but only abnormal results are displayed)  Labs Reviewed  SARS CORONAVIRUS 2 (HOSPITAL ORDER, [place] [facility]) - Abnormal; Notable for the following components:  Result Value   SARS Coronavirus 2 POSITIVE (*)    All other components within normal limits  COMPREHENSIVE METABOLIC PANEL - Abnormal; Notable for the following components:   CO2 20 (*)    Glucose, Bld 132 (*)    BUN 58 (*)    Creatinine, Ser 2.41 (*)    Calcium 8.0 (*)    Total Protein 6.0 (*)    Albumin 3.0 (*)    AST 45 (*)    GFR calc non Af Amer 22 (*)    GFR calc Af Amer 26 (*)    All other components within normal limits  CBC - Abnormal; Notable for  the following components:   RBC 3.09 (*)    Hemoglobin 9.7 (*)    HCT 29.3 (*)    All other components within normal limits  LACTIC ACID, PLASMA - Abnormal; Notable for the following components:   Lactic Acid, Venous 3.1 (*)    All other components within normal limits  LACTIC ACID, PLASMA - Abnormal; Notable for the following components:   Lactic Acid, Venous 2.5 (*)    All other components within normal limits  PROTIME-INR - Abnormal; Notable for the following components:   Prothrombin Time 16.3 (*)    INR 1.3 (*)    All other components within normal limits  URINALYSIS, ROUTINE W REFLEX MICROSCOPIC - Abnormal; Notable for the following components:   Protein, ur 30 (*)    Bacteria, UA RARE (*)    All other components within normal limits  TROPONIN I (HIGH SENSITIVITY) - Abnormal; Notable for the following components:   Troponin I (High Sensitivity) 55 (*)    All other components within normal limits  TROPONIN I (HIGH SENSITIVITY) - Abnormal; Notable for the following components:   Troponin I (High Sensitivity) 55 (*)    All other components within normal limits  C-REACTIVE PROTEIN - Abnormal; Notable for the following components:   CRP 16.5 (*)    All other components within normal limits  BRAIN NATRIURETIC PEPTIDE - Abnormal; Notable for the following components:   B Natriuretic Peptide 122.3 (*)    All other components within normal limits  D-DIMER, QUANTITATIVE (NOT AT [facility]) - Abnormal; Notable for the following components:   D-Dimer, Quant 2.95 (*)    All other components within normal limits  FERRITIN - Abnormal; Notable for the following components:   Ferritin 618 (*)    All other components within normal limits  FIBRINOGEN - Abnormal; Notable for the following components:   Fibrinogen 486 (*)    All other components within normal limits  LACTATE DEHYDROGENASE - Abnormal; Notable for the following components:   LDH 282 (*)    All other components within normal limits   CBC WITH DIFFERENTIAL/PLATELET - Abnormal; Notable for the following components:   RBC 3.18 (*)    Hemoglobin 10.0 (*)    HCT 29.5 (*)    Lymphs Abs 0.2 (*)    Abs Immature Granulocytes 0.30 (*)    All other components within normal limits  COMPREHENSIVE METABOLIC PANEL - Abnormal; Notable for the following components:   CO2 21 (*)    Glucose, Bld 153 (*)    BUN 51 (*)    Creatinine, Ser 2.03 (*)    Calcium 7.8 (*)    Total Protein 5.7 (*)    Albumin 2.7 (*)    AST 42 (*)    GFR calc non Af Amer 27 (*)    GFR calc Af Wyvonnia Lora  32 (*)    All other components within normal limits  C-REACTIVE PROTEIN - Abnormal; Notable for the following components:   CRP 17.3 (*)    All other components within normal limits  D-DIMER, QUANTITATIVE (NOT AT [facility]) - Abnormal; Notable for the following components:   D-Dimer, Quant 2.41 (*)    All other components within normal limits  FERRITIN - Abnormal; Notable for the following components:   Ferritin 617 (*)    All other components within normal limits  SEDIMENTATION RATE - Abnormal; Notable for the following components:   Sed Rate 50 (*)    All other components within normal limits  BRAIN NATRIURETIC PEPTIDE - Abnormal; Notable for the following components:   B Natriuretic Peptide 129.3 (*)    All other components within normal limits  CBC WITH DIFFERENTIAL/PLATELET - Abnormal; Notable for the following components:   WBC 11.6 (*)    RBC 3.25 (*)    Hemoglobin 10.0 (*)    HCT 31.0 (*)    Neutro Abs 10.6 (*)    Lymphs Abs 0.2 (*)    Abs Immature Granulocytes 0.50 (*)    All other components within normal limits  COMPREHENSIVE METABOLIC PANEL - Abnormal; Notable for the following components:   CO2 20 (*)    Glucose, Bld 125 (*)    BUN 69 (*)    Creatinine, Ser 2.14 (*)    Calcium 7.9 (*)    Total Protein 5.9 (*)    Albumin 2.7 (*)    GFR calc non Af Amer 26 (*)    GFR calc Af Amer 30 (*)    All other components within normal limits   C-REACTIVE PROTEIN - Abnormal; Notable for the following components:   CRP 21.3 (*)    All other components within normal limits  D-DIMER, QUANTITATIVE (NOT AT [facility]) - Abnormal; Notable for the following components:   D-Dimer, Quant 1.67 (*)    All other components within normal limits  FERRITIN - Abnormal; Notable for the following components:   Ferritin 1,060 (*)    All other components within normal limits  LACTATE DEHYDROGENASE - Abnormal; Notable for the following components:   LDH 349 (*)    All other components within normal limits  CULTURE, BLOOD (ROUTINE X 2)  CULTURE, BLOOD (ROUTINE X 2)  URINE CULTURE  MRSA PCR SCREENING  APTT  PROCALCITONIN  CK  MAGNESIUM  PHOSPHORUS  MAGNESIUM  ABO/RH   ____________________________________________  EKG   EKG Interpretation  Date/Time:  Wednesday November 27 2018 15:46:47 EDT Ventricular Rate:  87 PR Interval:    QRS Duration: 151 QT Interval:  440 QTC Calculation: 530 R Axis:   -62 Text Interpretation:  Atrial fibrillation Nonspecific IVCD with LAD LVH with secondary repolarization abnormality Inferior infarct, acute (RCA) Anterior infarct, old Probable RV involvement, suggest recording right precordial leads likely LVH changes, no obvious changes from march 2017 Confirmed by Merrily Pew [phone]) on 11/30/2018 5:16:07 PM       ____________________________________________  RADIOLOGY  No results found.  ____________________________________________   PROCEDURES  Procedure(s) performed:   .Critical Care Performed by: Merrily Pew, MD Authorized by: Merrily Pew, MD   Critical care provider statement:    Critical care time (minutes):  45   Critical care was necessary to treat or prevent imminent or life-threatening deterioration of the following conditions:  Respiratory failure   Critical care was time spent personally by me on the following activities:  Discussions with consultants, evaluation of patient's  response  to treatment, examination of patient, ordering and performing treatments and interventions, ordering and review of laboratory studies, ordering and review of radiographic studies, pulse oximetry, re-evaluation of patient's condition, obtaining history from patient or surrogate and review of old charts     ____________________________________________   INITIAL IMPRESSION / ASSESSMENT AND PLAN / ED COURSE  Camarion Weier was evaluated in Emergency Department on 12/17/2018 for the symptoms described in the history of present illness. He was evaluated in the context of the global COVID-19 pandemic, which necessitated consideration that the patient might be at risk for infection with the SARS-CoV-2 virus that causes COVID-19. Institutional protocols and algorithms that pertain to the evaluation of patients at risk for COVID-19 are in a state of rapid change based on information released by regulatory bodies including the CDC and federal and state organizations. These policies and algorithms were followed during the patient's care in the ED.   Evaluate for infectious versus cardiac causes for his symptoms.  He is hypoxic and tachypneic with crackles in the right lung base could be pneumonia versus coronavirus versus heart failure.  Not usually on oxygen so may need to be admitted for same.   Pertinent labs & imaging results that were available during my care of the patient were reviewed by me and considered in my medical decision making (see chart for details).   ____________________________________________  FINAL CLINICAL IMPRESSION(S) / ED DIAGNOSES  Final diagnoses:  [id number] virus detected  Hypoxia     MEDICATIONS GIVEN DURING THIS VISIT:  Medications - No data to display   NEW OUTPATIENT MEDICATIONS STARTED DURING THIS VISIT:  New Prescriptions   No medications on file    Note:  This note was prepared with assistance of Dragon voice recognition software. Occasional wrong-word or  sound-a-like substitutions may have occurred due to the inherent limitations of voice recognition software.   Amjad Fikes, Corene Cornea, MD 11/30/18 [phone]

## 2018-11-27 NOTE — ED Notes (Addendum)
ED TO INPATIENT HANDOFF REPORT  ED Nurse Name and Phone #: Lorrin Goodell, RN [phone]  S Name/Age/Gender Lawrence Hicks 85 y.o. male Room/Bed: 017C/017C  Code Status   Code Status: Prior  Home/SNF/Other Nursing Home Patient oriented to: self, place, time and situation Is this baseline? Yes   Triage Complete: Triage complete  Chief Complaint R/O COVID  Triage Note Pt from [facility], facility reports decreased appetite and pt not acting himself since yesterday. Covid cases are present at facility. Pt oriented x 4 with EMS, has to be somewhat aroused to answer.  EKG showed Afib. Pt satting high 80's when EMS arrived, placed on 6L [place]-does not wear oxygen at baseline. Pt then weaned to 2L at 98%. Pt reports generalized pain.    Allergies Allergies  Allergen Reactions  . Coreg [Carvedilol] Other (See Comments)    Weakness     Level of Care/Admitting Diagnosis ED Disposition    ED Disposition Condition [facility] Area: [place] [[id number]]  Level of Care: Telemetry Cardiac [103]  Covid Evaluation: Confirmed COVID Positive  Diagnosis: Acute respiratory disease due to COVID-19 virus [[id number]]  Admitting Physician: Lenore Cordia [[id number]]  Attending Physician: Lenore Cordia [[id number]]  Estimated length of stay: past midnight tomorrow  Certification:: I certify this patient will need inpatient services for at least 2 midnights  PT Class (Do Not Modify): Inpatient [101]  PT Acc Code (Do Not Modify): Private [1]       B Medical/Surgery History Past Medical History:  Diagnosis Date  . Cellulitis of left orbit   . Hypertension   . LBBB (left bundle branch block)   . Primary spontaneous pneumothorax   . Violent behavior    Past Surgical History:  Procedure Laterality Date  . CYST EXCISION     85 yrs old     A IV Location/Drains/Wounds Patient Lines/Drains/Airways Status   Active Line/Drains/Airways    Name:   Placement date:    Placement time:   Site:   Days:   Peripheral IV 11/21/2018 Right Arm   12/18/2018    1607    Arm   less than 1   Peripheral IV 11/28/2018 Right Arm   12/14/2018    -    Arm   less than 1          Intake/Output Last 24 hours  Intake/Output Summary (Last 24 hours) at 12/08/2018 2044 Last data filed at 12/10/2018 1849 Gross per 24 hour  Intake 1000 ml  Output -  Net 1000 ml    Labs/Imaging Results for orders placed or performed during the hospital encounter of 12/01/2018 (from the past 48 hour(s))  Comprehensive metabolic panel     Status: Abnormal   Collection Time: 11/28/2018  4:00 PM  Result Value Ref Range   Sodium 138 135 - 145 mmol/L   Potassium 3.9 3.5 - 5.1 mmol/L   Chloride 105 98 - 111 mmol/L   CO2 20 (L) 22 - 32 mmol/L   Glucose, Bld 132 (H) 70 - 99 mg/dL   BUN 58 (H) 8 - 23 mg/dL   Creatinine, Ser 2.41 (H) 0.61 - 1.24 mg/dL   Calcium 8.0 (L) 8.9 - 10.3 mg/dL   Total Protein 6.0 (L) 6.5 - 8.1 g/dL   Albumin 3.0 (L) 3.5 - 5.0 g/dL   AST 45 (H) 15 - 41 U/L   ALT 32 0 - 44 U/L   Alkaline Phosphatase 90 38 - 126 U/L  Total Bilirubin 0.9 0.3 - 1.2 mg/dL   GFR calc non Af Amer 22 (L) >60 mL/min   GFR calc Af Amer 26 (L) >60 mL/min   Anion gap 13 5 - 15    Comment: Performed at [facility] [address], [place], [place] [address]  CBC     Status: Abnormal   Collection Time: 11/26/2018  4:00 PM  Result Value Ref Range   WBC 6.2 4.0 - 10.5 K/uL   RBC 3.09 (L) 4.22 - 5.81 MIL/uL   Hemoglobin 9.7 (L) 13.0 - 17.0 g/dL   HCT 29.3 (L) 39.0 - 52.0 %   MCV 94.8 80.0 - 100.0 fL   MCH 31.4 26.0 - 34.0 pg   MCHC 33.1 30.0 - 36.0 g/dL   RDW 14.0 11.5 - 15.5 %   Platelets 179 150 - 400 K/uL   nRBC 0.0 0.0 - 0.2 %    Comment: Performed at [facility], [place] [address], [place], [place] [address]  Lactic acid, plasma     Status: Abnormal   Collection Time: 12/01/2018  4:00 PM  Result Value Ref Range   Lactic Acid, Venous 3.1 (HH) 0.5 - 1.9 mmol/L    Comment: CRITICAL RESULT  CALLED TO, READ BACK BY AND VERIFIED WITH: K.COBB RN [id number] 11/28/2018 MCCORMICK K Performed at [facility], [place] [address] [place], [place] [address]   SARS Coronavirus 2 (CEPHEID- Performed in [facility] hospital lab), Hosp Order     Status: Abnormal   Collection Time: 12/17/2018  4:00 PM   Specimen: Nasopharyngeal Swab  Result Value Ref Range   SARS Coronavirus 2 POSITIVE (A) NEGATIVE    Comment: RESULT CALLED TO, READ BACK BY AND VERIFIED WITH: K CARTER RN 12/06/2018 1745 JDW (NOTE) If result is NEGATIVE SARS-CoV-2 target nucleic acids are NOT DETECTED. The SARS-CoV-2 RNA is generally detectable in upper and lower  respiratory specimens during the acute phase of infection. The lowest  concentration of SARS-CoV-2 viral copies this assay can detect is 250  copies / mL. A negative result does not preclude SARS-CoV-2 infection  and should not be used as the sole basis for treatment or other  patient management decisions.  A negative result may occur with  improper specimen collection / handling, submission of specimen other  than nasopharyngeal swab, presence of viral mutation(s) within the  areas targeted by this assay, and inadequate number of viral copies  (<250 copies / mL). A negative result Hicks be combined with clinical  observations, patient history, and epidemiological information. If result is POSITIVE SARS-CoV-2 target nucleic acids are DETECTED. The SARS- CoV-2 RNA is generally detectable in upper and lower  respiratory specimens during the acute phase of infection.  Positive  results are indicative of active infection with SARS-CoV-2.  Clinical  correlation with patient history and other diagnostic information is  necessary to determine patient infection status.  Positive results do  not rule out bacterial infection or co-infection with other viruses. If result is PRESUMPTIVE POSTIVE SARS-CoV-2 nucleic acids MAY BE PRESENT.   A presumptive positive result was  obtained on the submitted specimen  and confirmed on repeat testing.  While 2019 novel coronavirus  (SARS-CoV-2) nucleic acids may be present in the submitted sample  additional confirmatory testing may be necessary for epidemiological  and / or clinical management purposes  to differentiate between  SARS-CoV-2 and other Sarbecovirus currently known to infect humans.  If clinically indicated additional testing with an  alternate test  methodology [phone]) is advised . The SARS-CoV-2 RNA is generally  detectable in upper and lower respiratory specimens during the acute  phase of infection. The expected result is Negative. Fact Sheet for Patients:  StrictlyIdeas.no Fact Sheet for Healthcare Providers: BankingDealers.co.za This test is not yet approved or cleared by the [place] FDA and has been authorized for detection and/or diagnosis of SARS-CoV-2 by FDA under an Emergency Use Authorization (EUA).  This EUA will remain in effect (meaning this test can be used) for the duration of the COVID-19 declaration under Section 564(b)(1) of the Act, 21 U.S.C. section 360bbb-3(b)(1), unless the authorization is terminated or revoked sooner. Performed at [facility], [place] [address], [place], [place] [address]   Urinalysis, Routine w reflex microscopic     Status: Abnormal   Collection Time: 11/28/2018  4:50 PM  Result Value Ref Range   Color, Urine YELLOW YELLOW   APPearance CLEAR CLEAR   Specific Gravity, Urine 1.013 1.005 - 1.030   pH 6.0 5.0 - 8.0   Glucose, UA NEGATIVE NEGATIVE mg/dL   Hgb urine dipstick NEGATIVE NEGATIVE   Bilirubin Urine NEGATIVE NEGATIVE   Ketones, ur NEGATIVE NEGATIVE mg/dL   Protein, ur 30 (A) NEGATIVE mg/dL   Nitrite NEGATIVE NEGATIVE   Leukocytes,Ua NEGATIVE NEGATIVE   RBC / HPF 0-5 0 - 5 RBC/hpf   WBC, UA 0-5 0 - 5 WBC/hpf   Bacteria, UA RARE (A) NONE SEEN   Mucus PRESENT     Comment: Performed at  [facility] [address] [place], [place] [address]  APTT     Status: None   Collection Time: 12/08/2018  4:55 PM  Result Value Ref Range   aPTT 34 24 - 36 seconds    Comment: Performed at [facility] [address] [place], [place] [address]  Protime-INR     Status: Abnormal   Collection Time: 12/10/2018  4:55 PM  Result Value Ref Range   Prothrombin Time 16.3 (H) 11.4 - 15.2 seconds   INR 1.3 (H) 0.8 - 1.2    Comment: (NOTE) INR goal varies based on device and disease states. Performed at [facility], [place] [address] [place], [place] [address]   Troponin I (High Sensitivity)     Status: Abnormal   Collection Time: 12/16/2018  5:33 PM  Result Value Ref Range   Troponin I (High Sensitivity) 55 (H) <18 ng/L    Comment: (NOTE) Elevated high sensitivity troponin I (hsTnI) values and significant  changes across serial measurements may suggest ACS but many other  chronic and acute conditions are known to elevate hsTnI results.  Refer to the "Links" section for chest pain algorithms and additional  guidance. Performed at [facility], [place] [address], [place], [place] [address]   Lactic acid, plasma     Status: Abnormal   Collection Time: 12/19/2018  6:18 PM  Result Value Ref Range   Lactic Acid, Venous 2.5 (HH) 0.5 - 1.9 mmol/L    Comment: CRITICAL RESULT CALLED TO, READ BACK BY AND VERIFIED WITH: E.BINKS RN 1907 12/13/2018 MCCORMICK K Performed at [facility] [address] [place],  [address]    Dg Chest Port 1 View  Result Date: 12/06/2018 CLINICAL DATA:  Pneumonia EXAM: PORTABLE CHEST 1 VIEW COMPARISON:  Portable exam at 1646 hrs compared to 09/21/2018 FINDINGS: Kyphotic positioning. Upper normal heart size. Mediastinal contours and pulmonary vascularity normal. Patchy infiltrates in  the mid to lower lungs bilaterally question pneumonia. Component of atelectasis at LEFT base is also present. No pleural effusion or pneumothorax. Extensive  degenerative changes of the LEFT glenohumeral joint with osseous demineralization noted. IMPRESSION: Patchy bibasilar infiltrates consistent with pneumonia. LEFT basilar atelectasis. Electronically Signed   By: Lavonia Dana M.D.   On: 11/28/2018 17:23    Pending Labs Unresulted Labs (From admission, onward)    Start     Ordered   12/20/2018 1724  Troponin I (High Sensitivity)  STAT Now then every 2 hours,   R (with STAT occurrences)    Question:  Indication  Answer:  Suspect ACS   11/30/2018 1723   12/18/2018 1612  CBC WITH DIFFERENTIAL  ONCE - STAT,   STAT     12/05/2018 1612   12/07/2018 1612  Blood Culture (routine x 2)  BLOOD CULTURE X 2,   STAT     12/14/2018 1612   12/12/2018 1612  Urine culture  ONCE - STAT,   STAT     11/28/2018 1612          Vitals/Pain Today's Vitals   12/17/2018 1945 12/04/2018 2000 12/08/2018 2015 11/21/2018 2030  BP: 128/74 118/83 123/68 120/67  Pulse: 67 (!) 104 85 (!) 42  Resp: (!) 24 (!) 29 (!) 29 (!) 27  Temp:      TempSrc:      SpO2: 94% 93% 92% 91%  Weight:      Height:      PainSc:        Isolation Precautions Airborne and Contact precautions  Medications Medications  lactated ringers bolus 1,000 mL (0 mLs Intravenous Stopped 12/08/2018 1849)  lactated ringers bolus 1,000 mL (1,000 mLs Intravenous New Bag/Given 11/21/2018 1849)    Mobility non-ambulatory High fall risk (hypoxic)  Focused Assessments Pulmonary Assessment Handoff:  Lung sounds: Bilateral Breath Sounds: Diminished L Breath Sounds: Diminished R Breath Sounds: Fine crackles O2 Device: Nasal Cannula O2 Flow Rate (L/min): 3 L/min      R Recommendations: See Admitting Provider Note  Report given to: Verdene Lennert, RN  Additional Notes:

## 2018-11-27 NOTE — ED Notes (Addendum)
Pt placed on 4L for sats of 83% on RA. Will notify MD Mesner and continue to monitor.

## 2018-11-28 DIAGNOSIS — E872 Acidosis: Secondary | ICD-10-CM

## 2018-11-28 DIAGNOSIS — I10 Essential (primary) hypertension: Secondary | ICD-10-CM

## 2018-11-28 DIAGNOSIS — R7989 Other specified abnormal findings of blood chemistry: Secondary | ICD-10-CM

## 2018-11-28 DIAGNOSIS — I4821 Permanent atrial fibrillation: Secondary | ICD-10-CM

## 2018-11-28 DIAGNOSIS — N184 Chronic kidney disease, stage 4 (severe): Secondary | ICD-10-CM

## 2018-11-28 DIAGNOSIS — R4689 Other symptoms and signs involving appearance and behavior: Secondary | ICD-10-CM

## 2018-11-28 LAB — C-REACTIVE PROTEIN: CRP: 17.3 mg/dL — ABNORMAL HIGH (ref <1.0)

## 2018-11-28 LAB — CBC WITH DIFFERENTIAL/PLATELET
Abs Immature Granulocytes: 0.3 10*3/uL — ABNORMAL HIGH (ref 0.00–0.07)
Basophils Absolute: 0 10*3/uL (ref 0.0–0.1)
Basophils Relative: 0 %
Eosinophils Absolute: 0 10*3/uL (ref 0.0–0.5)
Eosinophils Relative: 0 %
HCT: 29.5 % — ABNORMAL LOW (ref 39.0–52.0)
Hemoglobin: 10 g/dL — ABNORMAL LOW (ref 13.0–17.0)
Immature Granulocytes: 6 %
Lymphocytes Relative: 4 %
Lymphs Abs: 0.2 10*3/uL — ABNORMAL LOW (ref 0.7–4.0)
MCH: 31.4 pg (ref 26.0–34.0)
MCHC: 33.9 g/dL (ref 30.0–36.0)
MCV: 92.8 fL (ref 80.0–100.0)
Monocytes Absolute: 0.2 10*3/uL (ref 0.1–1.0)
Monocytes Relative: 3 %
Neutro Abs: 4.7 10*3/uL (ref 1.7–7.7)
Neutrophils Relative %: 87 %
Platelets: 173 10*3/uL (ref 150–400)
RBC: 3.18 MIL/uL — ABNORMAL LOW (ref 4.22–5.81)
RDW: 13.8 % (ref 11.5–15.5)
WBC: 5.4 10*3/uL (ref 4.0–10.5)
nRBC: 0 % (ref 0.0–0.2)

## 2018-11-28 LAB — COMPREHENSIVE METABOLIC PANEL
ALT: 32 U/L (ref 0–44)
AST: 42 U/L — ABNORMAL HIGH (ref 15–41)
Albumin: 2.7 g/dL — ABNORMAL LOW (ref 3.5–5.0)
Alkaline Phosphatase: 78 U/L (ref 38–126)
Anion gap: 13 (ref 5–15)
BUN: 51 mg/dL — ABNORMAL HIGH (ref 8–23)
CO2: 21 mmol/L — ABNORMAL LOW (ref 22–32)
Calcium: 7.8 mg/dL — ABNORMAL LOW (ref 8.9–10.3)
Chloride: 105 mmol/L (ref 98–111)
Creatinine, Ser: 2.03 mg/dL — ABNORMAL HIGH (ref 0.61–1.24)
GFR calc Af Amer: 32 mL/min — ABNORMAL LOW (ref >60)
GFR calc non Af Amer: 27 mL/min — ABNORMAL LOW (ref >60)
Glucose, Bld: 153 mg/dL — ABNORMAL HIGH (ref 70–99)
Potassium: 3.8 mmol/L (ref 3.5–5.1)
Sodium: 139 mmol/L (ref 135–145)
Total Bilirubin: 0.9 mg/dL (ref 0.3–1.2)
Total Protein: 5.7 g/dL — ABNORMAL LOW (ref 6.5–8.1)

## 2018-11-28 LAB — URINE CULTURE: Culture: NO GROWTH

## 2018-11-28 LAB — D-DIMER, QUANTITATIVE: D-Dimer, Quant: 2.41 ug/mL-FEU — ABNORMAL HIGH (ref 0.00–0.50)

## 2018-11-28 LAB — FERRITIN: Ferritin: 617 ng/mL — ABNORMAL HIGH (ref 24–336)

## 2018-11-28 LAB — PHOSPHORUS: Phosphorus: 3.8 mg/dL (ref 2.5–4.6)

## 2018-11-28 LAB — MAGNESIUM: Magnesium: 1.7 mg/dL (ref 1.7–2.4)

## 2018-11-28 LAB — CK: Total CK: 299 U/L (ref 49–397)

## 2018-11-28 MED ORDER — ATORVASTATIN CALCIUM 10 MG PO TABS
10.0000 mg | ORAL_TABLET | Freq: Every day | ORAL | Status: DC
  Administered 2018-11-28: 16:00:00 10 mg via ORAL
  Filled 2018-11-28: qty 1

## 2018-11-28 MED ORDER — RISPERIDONE 1 MG PO TABS
1.0000 mg | ORAL_TABLET | Freq: Every day | ORAL | Status: DC
  Filled 2018-11-28 (×2): qty 1

## 2018-11-28 MED ORDER — ASPIRIN 81 MG PO CHEW
81.0000 mg | CHEWABLE_TABLET | Freq: Every day | ORAL | Status: DC
  Administered 2018-11-28: 81 mg via ORAL
  Filled 2018-11-28: qty 1

## 2018-11-28 MED ORDER — LIDOCAINE HCL URETHRAL/MUCOSAL 2 % EX PRSY
1.0000 "application " | PREFILLED_SYRINGE | Freq: Once | CUTANEOUS | Status: DC
  Filled 2018-11-28: qty 20

## 2018-11-28 MED ORDER — MAGNESIUM SULFATE IN D5W 1-5 GM/100ML-% IV SOLN
1.0000 g | Freq: Once | INTRAVENOUS | Status: AC
  Administered 2018-11-28: 1 g via INTRAVENOUS
  Filled 2018-11-28: qty 100

## 2018-11-28 MED ORDER — LIDOCAINE HCL URETHRAL/MUCOSAL 2 % EX GEL: 1.0000 "application " | Freq: Once | CUTANEOUS | Status: DC

## 2018-11-28 NOTE — Progress Notes (Signed)
[facility] notified that patient Bladder scan greater that 1,000 mls.and 2 nurses attempted to place 14 fr foley and unsuccessful due to patient anatomy. Arthor Captain LPN

## 2018-11-28 NOTE — Significant Event (Signed)
Rapid Response Event Note  Overview: Time Called: 0910 Arrival Time: 0913 Event Type: Respiratory  Initial Focused Assessment: Per RN Patient had a desat episode while eating breakfast.  O2 sats decreased to 66% on 7L HF O2 Increased to 15L HFNC Encouraged to cough and deep breath. O2 sats improved to 99% on HF Attempted to wean O2 to 4 L desat 85% Patient tolerated O2 at 8L HFNC O2 sats 93%.  Pt still tachpnic but not labored.  Plan: Transfer to [facility]    Interventions:  Plan of Care (if not transferred):  Event Summary: Name of Physician Notified: Taye T. Gonfa at [phone]  Name of Consulting Physician Notified: Agarwala at [facility]: Transferred (Comment)([place])     [place], [place]

## 2018-11-28 NOTE — Progress Notes (Addendum)
PROGRESS NOTE  Lawrence Hicks ZWC:[id number] DOB: Oct 01, 1923   PCP: Patient, No Pcp Per  Patient is from: [facility]  DOA: 12/08/2018 LOS: 1  Brief Narrative / Interim history: 85 y.o. male with medical history significant for hypertension, LBBB, CKD stage III/IV, and suspected underlying dementia with behavioral disorder who presents to the ED from [facility] for evaluation of poor appetite, fatigue, and shortness of breath.  Reported to have desaturation to high 80s per EMS.  He was placed on 6 L and weaned down to 2 L on arrival to ED. Of note patient had a recently prolonged ED stay from 09/21/2018-10/11/2018 for confusion and memory loss prior to discharge to [facility] [place].  He had a SARS-CoV-2 test negative on 09/28/2018.  In ED, hemodynamically stable.  Saturation 94% on 4 L by nasal cannula.  Hgb 9.7 (about baseline) and lymphopenic.  Otherwise, CBC not impressive.  Creatinine 2.41 (about baseline).  Lactic acid 3.1> 2.5.  EKG rate controlled A. fib with severe LAD and some PVCs which is basically unchanged from baseline.  Troponin flat at 552.  COVID-19 positive.  Portable CXR with patchy bibasilar infiltrates.  Inflammatory markers for COVID-19 elevated.  Patient was started on Solu-Medrol and Remdesivirwas admitted to [facility] for possible cardiac evaluation by cardiology.   Patient was discussed with cardiology, Dr. Percival Spanish and he did not recommend further cardiac work-up.  Subjective: Patient had an episode of desaturation early this morning likely from choking while eating breakfast.  Was put on high flow nasal cannula and recovered to upper 90s.  He is high flow nasal cannula titrated down to 6 L and he is saturating about upper 80s to lower 90s.  He does not appear to be in distress.  Denies chest pain.  Evaluated by PCCM.  Objective: Vitals:   11/21/2018 2030 11/24/2018 2045 12/07/2018 2259 11/28/18 0700  BP: 120/67 130/64 135/66 117/86  Pulse:  (!) 55 61 65  Resp:  (!) 27 (!) 21 19 18   Temp:   98.4 F (36.9 C)   TempSrc:   Oral   SpO2: 91% 92% 94% 92%  Weight:      Height:        Intake/Output Summary (Last 24 hours) at 11/28/2018 0952 Last data filed at 11/28/2018 0943 Gross per 24 hour  Intake 1000 ml  Output 1300 ml  Net -300 ml   Filed Weights   11/23/2018 1811  Weight: 54.4 kg    Examination:  GENERAL: No acute distress.  On high flow nasal cannula. HEENT: MMM.  Vision and hearing grossly intact.  NECK: Supple.  No apparent JVD.  LUNGS: On HF [place].  No increased work of breathing.  Fair air movement bilaterally.  Rhonchi. HEART: IR.  Normal rate.  Heart sounds normal.  ABD: Bowel sounds present. Soft. Non tender.  MSK/EXT:  Moves extremities. No apparent deformity or edema.  SKIN: no apparent skin lesion or wound NEURO: Awake, alert and oriented to his surroundings and situation.  No gross deficit.  PSYCH: Calm. Normal affect.   I have personally reviewed the following labs and images:  Radiology Studies: Dg Chest Port 1 View  Result Date: 12/04/2018 CLINICAL DATA:  Pneumonia EXAM: PORTABLE CHEST 1 VIEW COMPARISON:  Portable exam at 1646 hrs compared to 09/21/2018 FINDINGS: Kyphotic positioning. Upper normal heart size. Mediastinal contours and pulmonary vascularity normal. Patchy infiltrates in the mid to lower lungs bilaterally question pneumonia. Component of atelectasis at LEFT base is also present. No  pleural effusion or pneumothorax. Extensive degenerative changes of the LEFT glenohumeral joint with osseous demineralization noted. IMPRESSION: Patchy bibasilar infiltrates consistent with pneumonia. LEFT basilar atelectasis. Electronically Signed   By: Lavonia Dana M.D.   On: 12/11/2018 17:23    Microbiology: Recent Results (from the past 240 hour(s))  SARS Coronavirus 2 (CEPHEID- Performed in [place] hospital lab), Hosp Order     Status: Abnormal   Collection Time: 11/26/2018  4:00 PM   Specimen: Nasopharyngeal Swab  Result  Value Ref Range Status   SARS Coronavirus 2 POSITIVE (A) NEGATIVE Final    Comment: RESULT CALLED TO, READ BACK BY AND VERIFIED WITH: K CARTER RN 12/03/2018 1745 JDW (NOTE) If result is NEGATIVE SARS-CoV-2 target nucleic acids are NOT DETECTED. The SARS-CoV-2 RNA is generally detectable in upper and lower  respiratory specimens during the acute phase of infection. The lowest  concentration of SARS-CoV-2 viral copies this assay can detect is 250  copies / mL. A negative result does not preclude SARS-CoV-2 infection  and should not be used as the sole basis for treatment or other  patient management decisions.  A negative result may occur with  improper specimen collection / handling, submission of specimen other  than nasopharyngeal swab, presence of viral mutation(s) within the  areas targeted by this assay, and inadequate number of viral copies  (<250 copies / mL). A negative result must be combined with clinical  observations, patient history, and epidemiological information. If result is POSITIVE SARS-CoV-2 target nucleic acids are DETECTED. The SARS- CoV-2 RNA is generally detectable in upper and lower  respiratory specimens during the acute phase of infection.  Positive  results are indicative of active infection with SARS-CoV-2.  Clinical  correlation with patient history and other diagnostic information is  necessary to determine patient infection status.  Positive results do  not rule out bacterial infection or co-infection with other viruses. If result is PRESUMPTIVE POSTIVE SARS-CoV-2 nucleic acids MAY BE PRESENT.   A presumptive positive result was obtained on the submitted specimen  and confirmed on repeat testing.  While 2019 novel coronavirus  (SARS-CoV-2) nucleic acids may be present in the submitted sample  additional confirmatory testing may be necessary for epidemiological  and / or clinical management purposes  to differentiate between  SARS-CoV-2 and other  Sarbecovirus currently known to infect humans.  If clinically indicated additional testing with an alternate test  methodology [phone]) is advised . The SARS-CoV-2 RNA is generally  detectable in upper and lower respiratory specimens during the acute  phase of infection. The expected result is Negative. Fact Sheet for Patients:  StrictlyIdeas.no Fact Sheet for Healthcare Providers: BankingDealers.co.za This test is not yet approved or cleared by the [place] FDA and has been authorized for detection and/or diagnosis of SARS-CoV-2 by FDA under an Emergency Use Authorization (EUA).  This EUA will remain in effect (meaning this test can be used) for the duration of the COVID-19 declaration under Section 564(b)(1) of the Act, 21 U.S.C. section 360bbb-3(b)(1), unless the authorization is terminated or revoked sooner. Performed at [facility], [place] [address], [place], [place] [address]   MRSA PCR Screening     Status: None   Collection Time: 12/19/2018  9:42 PM   Specimen: Nasopharyngeal  Result Value Ref Range Status   MRSA by PCR NEGATIVE NEGATIVE Final    Comment:        The GeneXpert MRSA Assay (FDA approved for NASAL specimens only), is one component of  a comprehensive MRSA colonization surveillance program. It is not intended to diagnose MRSA infection nor to guide or monitor treatment for MRSA infections. Performed at [facility], [place] [address] [place], [place] [address]     Sepsis Labs: Invalid input(s): PROCALCITONIN, LACTICIDVEN  Urine analysis:    Component Value Date/Time   COLORURINE YELLOW 11/28/2018 1650   APPEARANCEUR CLEAR 12/03/2018 1650   LABSPEC 1.013 11/24/2018 1650   PHURINE 6.0 12/18/2018 1650   GLUCOSEU NEGATIVE 11/26/2018 1650   HGBUR NEGATIVE 11/30/2018 1650   BILIRUBINUR NEGATIVE 12/04/2018 1650   KETONESUR NEGATIVE 12/08/2018 1650   PROTEINUR 30 (A) 11/28/2018 1650   NITRITE  NEGATIVE 12/14/2018 1650   LEUKOCYTESUR NEGATIVE 12/05/2018 1650    Anemia Panel: Recent Labs    12/04/2018 2143 11/28/18 0442  FERRITIN 618* 617*    Thyroid Function Tests: No results for input(s): TSH, T4TOTAL, FREET4, T3FREE, THYROIDAB in the last 72 hours.  Lipid Profile: No results for input(s): CHOL, HDL, LDLCALC, TRIG, CHOLHDL, LDLDIRECT in the last 72 hours.  CBG: No results for input(s): GLUCAP in the last 168 hours.  HbA1C: No results for input(s): HGBA1C in the last 72 hours.  BNP (last 3 results): No results for input(s): PROBNP in the last 8760 hours.  Cardiac Enzymes: Recent Labs  Lab 11/28/18 0442  CKTOTAL 299    Coagulation Profile: Recent Labs  Lab 11/26/2018 1655  INR 1.3*    Liver Function Tests: Recent Labs  Lab 11/26/2018 1600 11/28/18 0442  AST 45* 42*  ALT 32 32  ALKPHOS 90 78  BILITOT 0.9 0.9  PROT 6.0* 5.7*  ALBUMIN 3.0* 2.7*   No results for input(s): LIPASE, AMYLASE in the last 168 hours. No results for input(s): AMMONIA in the last 168 hours.  Basic Metabolic Panel: Recent Labs  Lab 11/28/2018 1600 11/28/18 0442  NA 138 139  K 3.9 3.8  CL 105 105  CO2 20* 21*  GLUCOSE 132* 153*  BUN 58* 51*  CREATININE 2.41* 2.03*  CALCIUM 8.0* 7.8*  MG  --  1.7  PHOS  --  3.8   GFR: Estimated Creatinine Clearance: 17.5 mL/min (A) (by C-G formula based on SCr of 2.03 mg/dL (H)).  CBC: Recent Labs  Lab 12/06/2018 1600 11/28/18 0442  WBC 6.2 5.4  NEUTROABS  --  4.7  HGB 9.7* 10.0*  HCT 29.3* 29.5*  MCV 94.8 92.8  PLT 179 173    Procedures:  None  Microbiology summarized: 7/8-COVID-19 positive 7/8-MRSA PCR negative 7/8-urine and blood culture pending  Assessment & Plan: Acute respiratory failure with hypoxemia due to COVID-19 pneumonia -COVID-19 inflammatory markers elevated. -Continue IV Solu-Medrol and Remdesvir -Supplemental oxygen by high flow nasal cannula. -Continue albuterol inhaler, mucolytic's vitamin C and  zinc. -Follow daily labs/inflammatory markers -Transfer to [place].  CKD4/mild azotemia: Creatinine 2.41 (about baseline).  Improving. -Hold nephrotoxic meds and recheck.  Lactic acidosis: Likely combination of respiratory failure and mild azotemia.  Downtrending -Continue monitoring.  Permanent A. Fib: Normal rate.  Not on rate or rhythm control.  Not on anticoagulation. -Doubt candidacy for long-term anticoagulation -Continue home meds  Elevated troponin: Flat at 552.  Likely demand ischemia from acute respiratory failure and also delayed clearance due to CKD-4.  Patient without chest pain.  Acute ischemic finding on EKG.  No further cardiac work-up per cardiology, Dr. Percival Spanish.  Acute urinary retention: Bladder scan over 1000 cc this morning.  Difficult Foley.  Urology consulted -Foley placed by urology, Dr. Junious Silk.  Anemia  of chronic disease: Hgb at baseline -Continue monitoring  Hypertension: Normotensive -Continue home amlodipine -Hold ramipril with the hope of getting some renal function back  History of behavioral disturbance/dementia: Stable. -Continue home risperidone and valproic acid  DVT prophylaxis: Subcu heparin Code Status: DNR per HCPOA.  Louis Meckel [phone] Family Communication: Patient and/or RN. Available if any question. Disposition Plan: Transfer to [facility].  Stepdown unit. Consultants: Cardiology over the phone   Antimicrobials: Anti-infectives (From admission, onward)   Start     Dose/Rate Route Frequency Ordered Stop   11/28/18 2330  remdesivir 100 mg in sodium chloride 0.9 % 250 mL IVPB     100 mg 500 mL/hr over 30 Minutes Intravenous Every 24 hours 11/26/2018 2318     12/05/2018 2315  remdesivir 200 mg in sodium chloride 0.9 % 250 mL IVPB     200 mg 500 mL/hr over 30 Minutes Intravenous Once 11/26/2018 2314 11/28/18 0700      Sch Meds:  Scheduled Meds: . albuterol  2 puff Inhalation Q6H  . amLODipine  5 mg Oral Daily  . heparin   5,000 Units Subcutaneous Q8H  . lidocaine  1 application Urethral Once  . methylPREDNISolone (SOLU-MEDROL) injection  40 mg Intravenous Q12H  . risperiDONE  1 mg Oral Daily  . sodium chloride flush  3 mL Intravenous Q12H  . valproic acid  250 mg Oral BID  . vitamin C  500 mg Oral Daily  . zinc sulfate  220 mg Oral Daily   Continuous Infusions: . remdesivir 100 mg in NS 250 mL     PRN Meds:.acetaminophen, chlorpheniramine-HYDROcodone, guaiFENesin-dextromethorphan   Janey Petron T. [place]  If 7PM-7AM, please contact night-coverage [email or web address] Password [email or web address] 11/28/2018, 9:52 AM   Addendum in red

## 2018-11-28 NOTE — Progress Notes (Signed)
I was attempting to set patient up with breakfast when I was pulled into another patient room, when I came out the patient's O2 had dropped from 90 to 66 on 7L. When I got into the room patient was tachypnic but not actively distress and was still trying to eat his breakfast. I took his food, bumped his oxygen up to 15L [place]. Patient came up to the upper 49s. MD was at the nurses station and was made aware and RN called the rapid response nurse both of whom came to the bedside. After a few minutes the patient's oxygen got up into the high 90's on 15L HFNC and he was weaned back to 8L where he was still tachpnic but his oxygen was at 93%. Plan is to transfer to [facility].

## 2018-11-28 NOTE — Progress Notes (Addendum)
Follow-up: Notified by bedside RN that team unable to insert foley or coude d/t unable to retract foreskin. Unable to visualize urinary meatus. Have paged on call urology (Dr Diona Fanti) multiple times ([phone], [place], [phone] and [phone]) as of yet no return page. Will continue to try to attempt to page urology service for placement of foley. Will notify rounding MD if unable to get response by end of shift.   Jeryl [place], NP-C [facility] [phone]

## 2018-11-28 NOTE — Consult Note (Signed)
Urology Consult  Consulting MD:K Schorr, NP  CC: Difficult catheter placement  HPI: This is an 85year old male in the hospital for COVID respiratory issues, unable to void.  The patient apparently has phimosis.  Catheter placement is necessary, and urology has been consulted.  By history, I have been paged 4 times.  Unfortunately, from what I can see our pager system is down (other pages have not come through from the emergency room last night, currently being worked on).  Specific urologic history is unknown.  PMH: Past Medical History:  Diagnosis Date  . Cellulitis of left orbit   . Hypertension   . LBBB (left bundle branch block)   . Primary spontaneous pneumothorax   . Violent behavior     PSH: Past Surgical History:  Procedure Laterality Date  . CYST EXCISION     85 yrs old    Allergies: Allergies  Allergen Reactions  . Coreg [Carvedilol] Other (See Comments)    Weakness     Medications: Medications Prior to Admission  Medication Sig Dispense Refill Last Dose  . amLODipine (NORVASC) 5 MG tablet Take 5 mg by mouth daily.   12/01/2018 at Unknown time  . ramipril (ALTACE) 2.5 MG capsule TAKE 1 CAPSULE BY MOUTH EVERY DAY (Patient taking differently: Take 2.5 mg by mouth daily. ) 90 capsule 0 12/05/2018 at Unknown time  . risperiDONE (RISPERDAL) 1 MG/ML oral solution Take 1 mg by mouth daily.   11/20/2018 at Unknown time  . valproic acid (DEPAKENE) 250 MG/5ML SOLN solution Take 250 mg by mouth 2 (two) times a day.   12/15/2018 at Unknown time     Social History: Social History   Socioeconomic History  . Marital status: Single    Spouse name: Not on file  . Number of children: Not on file  . Years of education: Not on file  . Highest education level: Not on file  Occupational History  . Not on file  Social Needs  . Financial resource strain: Not on file  . Food insecurity    Worry: Not on file    Inability: Not on file  . Transportation needs    Medical: Not on  file    Non-medical: Not on file  Tobacco Use  . Smoking status: Former Smoker    Quit date: 08/10/1955    Years since quitting: 63.3  . Smokeless tobacco: Never Used  . Tobacco comment: 50 yrs ago  Substance and Sexual Activity  . Alcohol use: Not Currently    Alcohol/week: 0.0 standard drinks  . Drug use: Never  . Sexual activity: Not Currently  Lifestyle  . Physical activity    Days per week: Not on file    Minutes per session: Not on file  . Stress: Not on file  Relationships  . Social Herbalist on phone: Not on file    Gets together: Not on file    Attends religious service: Not on file    Active member of club or organization: Not on file    Attends meetings of clubs or organizations: Not on file    Relationship status: Not on file  . Intimate partner violence    Fear of current or ex partner: Not on file    Emotionally abused: Not on file    Physically abused: Not on file    Forced sexual activity: Not on file  Other Topics Concern  . Not on file  Social History Narrative  . Not  on file    Family History: Family History  Family history unknown: Yes    Review of Systems: Level V caveat  Physical Exam: @VITALS2 @ General: Obtunded Head:  Normocephalic.  Atraumatic.   Penis:  Uncircumcised.  Severe phimosis noted.  Foreskin retraction impossible.   Studies:  Recent Labs    12/07/2018 1600 11/28/18 0442  HGB 9.7* 10.0*  WBC 6.2 5.4  PLT 179 173    Recent Labs    11/23/2018 1600 11/28/18 0442  NA 138 139  K 3.9 3.8  CL 105 105  CO2 20* 21*  BUN 58* 51*  CREATININE 2.41* 2.03*  CALCIUM 8.0* 7.8*  GFRNONAA 22* 27*  GFRAA 26* 32*     Recent Labs    12/06/2018 1655  INR 1.3*  APTT 34     Invalid input(s): ABG  After sterile prep and drape, and instillation of K-Y jelly in the patient's urethra, 16 French coud catheter easily passed through phimotic foreskin and into the bladder.  Balloon filled with 10 cc of water, hooked to  dependent drainage.  Clear urine obtained.  Assessment: Phimosis  Plan: Reconsult urology as necessary.    Pager:[phone]

## 2018-11-29 LAB — CBC WITH DIFFERENTIAL/PLATELET
Abs Immature Granulocytes: 0.5 10*3/uL — ABNORMAL HIGH (ref 0.00–0.07)
Band Neutrophils: 46 %
Basophils Absolute: 0 10*3/uL (ref 0.0–0.1)
Basophils Relative: 0 %
Eosinophils Absolute: 0 10*3/uL (ref 0.0–0.5)
Eosinophils Relative: 0 %
HCT: 31 % — ABNORMAL LOW (ref 39.0–52.0)
Hemoglobin: 10 g/dL — ABNORMAL LOW (ref 13.0–17.0)
Lymphocytes Relative: 2 %
Lymphs Abs: 0.2 10*3/uL — ABNORMAL LOW (ref 0.7–4.0)
MCH: 30.8 pg (ref 26.0–34.0)
MCHC: 32.3 g/dL (ref 30.0–36.0)
MCV: 95.4 fL (ref 80.0–100.0)
Metamyelocytes Relative: 1 %
Monocytes Absolute: 0.3 10*3/uL (ref 0.1–1.0)
Monocytes Relative: 3 %
Myelocytes: 3 %
Neutro Abs: 10.6 10*3/uL — ABNORMAL HIGH (ref 1.7–7.7)
Neutrophils Relative %: 45 %
Platelets: 194 10*3/uL (ref 150–400)
RBC: 3.25 MIL/uL — ABNORMAL LOW (ref 4.22–5.81)
RDW: 14.2 % (ref 11.5–15.5)
WBC Morphology: INCREASED
WBC: 11.6 10*3/uL — ABNORMAL HIGH (ref 4.0–10.5)
nRBC: 0 % (ref 0.0–0.2)

## 2018-11-29 LAB — COMPREHENSIVE METABOLIC PANEL
ALT: 30 U/L (ref 0–44)
AST: 41 U/L (ref 15–41)
Albumin: 2.7 g/dL — ABNORMAL LOW (ref 3.5–5.0)
Alkaline Phosphatase: 74 U/L (ref 38–126)
Anion gap: 15 (ref 5–15)
BUN: 69 mg/dL — ABNORMAL HIGH (ref 8–23)
CO2: 20 mmol/L — ABNORMAL LOW (ref 22–32)
Calcium: 7.9 mg/dL — ABNORMAL LOW (ref 8.9–10.3)
Chloride: 102 mmol/L (ref 98–111)
Creatinine, Ser: 2.14 mg/dL — ABNORMAL HIGH (ref 0.61–1.24)
GFR calc Af Amer: 30 mL/min — ABNORMAL LOW (ref >60)
GFR calc non Af Amer: 26 mL/min — ABNORMAL LOW (ref >60)
Glucose, Bld: 125 mg/dL — ABNORMAL HIGH (ref 70–99)
Potassium: 3.7 mmol/L (ref 3.5–5.1)
Sodium: 137 mmol/L (ref 135–145)
Total Bilirubin: 0.5 mg/dL (ref 0.3–1.2)
Total Protein: 5.9 g/dL — ABNORMAL LOW (ref 6.5–8.1)

## 2018-11-29 LAB — D-DIMER, QUANTITATIVE: D-Dimer, Quant: 1.67 ug/mL-FEU — ABNORMAL HIGH (ref 0.00–0.50)

## 2018-11-29 LAB — C-REACTIVE PROTEIN: CRP: 21.3 mg/dL — ABNORMAL HIGH (ref <1.0)

## 2018-11-29 LAB — BRAIN NATRIURETIC PEPTIDE: B Natriuretic Peptide: 129.3 pg/mL — ABNORMAL HIGH (ref 0.0–100.0)

## 2018-11-29 LAB — FERRITIN: Ferritin: 1060 ng/mL — ABNORMAL HIGH (ref 24–336)

## 2018-11-29 LAB — LACTATE DEHYDROGENASE: LDH: 349 U/L — ABNORMAL HIGH (ref 98–192)

## 2018-11-29 LAB — MAGNESIUM: Magnesium: 2.1 mg/dL (ref 1.7–2.4)

## 2018-11-29 MED ORDER — GLYCOPYRROLATE 1 MG PO TABS
1.0000 mg | ORAL_TABLET | ORAL | Status: DC | PRN
  Filled 2018-11-29: qty 1

## 2018-11-29 MED ORDER — BISACODYL 10 MG RE SUPP: 10.0000 mg | Freq: Every day | RECTAL | Status: DC | PRN

## 2018-11-29 MED ORDER — GLYCOPYRROLATE 0.2 MG/ML IJ SOLN: 0.2000 mg | INTRAMUSCULAR | Status: DC | PRN

## 2018-11-29 MED ORDER — MORPHINE SULFATE (PF) 2 MG/ML IV SOLN
2.0000 mg | INTRAVENOUS | Status: DC | PRN
  Administered 2018-11-29: 2 mg via INTRAVENOUS
  Filled 2018-11-29: qty 1

## 2018-11-29 MED ORDER — LORAZEPAM 0.5 MG PO TABS: 1.0000 mg | ORAL_TABLET | ORAL | Status: DC | PRN

## 2018-11-29 MED ORDER — MORPHINE 100MG IN NS 100ML (1MG/ML) PREMIX INFUSION
1.0000 mg/h | INTRAVENOUS | Status: DC
  Administered 2018-11-29: 1 mg/h via INTRAVENOUS
  Filled 2018-11-29: qty 100

## 2018-11-29 MED ORDER — LORAZEPAM 2 MG/ML PO CONC: 1.0000 mg | ORAL | Status: DC | PRN

## 2018-11-29 MED ORDER — BIOTENE DRY MOUTH MT LIQD: 15.0000 mL | OROMUCOSAL | Status: DC | PRN

## 2018-11-29 MED ORDER — MAGIC MOUTHWASH
15.0000 mL | Freq: Four times a day (QID) | ORAL | Status: DC | PRN
  Filled 2018-11-29: qty 15

## 2018-11-29 MED ORDER — MORPHINE SULFATE (PF) 2 MG/ML IV SOLN
2.0000 mg | Freq: Once | INTRAVENOUS | Status: AC
  Administered 2018-11-29: 2 mg via INTRAVENOUS
  Filled 2018-11-29: qty 1

## 2018-11-29 MED ORDER — ENSURE ENLIVE PO LIQD: 237.0000 mL | Freq: Three times a day (TID) | ORAL | Status: DC

## 2018-11-29 MED ORDER — POLYVINYL ALCOHOL 1.4 % OP SOLN
1.0000 [drp] | Freq: Four times a day (QID) | OPHTHALMIC | Status: DC | PRN
  Filled 2018-11-29: qty 15

## 2018-11-29 MED ORDER — LORAZEPAM 2 MG/ML IJ SOLN: 1.0000 mg | INTRAMUSCULAR | Status: DC | PRN

## 2018-11-29 MED ORDER — LACTATED RINGERS IV SOLN
INTRAVENOUS | Status: DC
  Administered 2018-11-29: 10:00:00 via INTRAVENOUS

## 2018-12-02 LAB — CULTURE, BLOOD (ROUTINE X 2)
Culture: NO GROWTH
Culture: NO GROWTH
Special Requests: ADEQUATE

## 2018-12-21 NOTE — Progress Notes (Signed)
Initial Nutrition Assessment   RD working remotely.   DOCUMENTATION CODES:   Not applicable  INTERVENTION:   Downgrade diet to Dysphagia III (EASY to CHEW)  Ensure Enlive po TID, each supplement provides 350 kcal and 20 grams of protein  Pt receiving Hormel Shake daily with Breakfast which provides 520 kcals and 22 g of protein and Magic cup BID with lunch and dinner, each supplement provides 290 kcal and 9 grams of protein, automatically on meal trays to optimize nutritional intake.   Feeding assistance as needed  NUTRITION DIAGNOSIS:   Inadequate oral intake related to acute illness, chronic illness, decreased appetite as evidenced by per patient/family report.  GOAL:   Patient will meet greater than or equal to 90% of their needs  MONITOR:   PO intake, Supplement acceptance, Labs, Weight trends, Skin  REASON FOR ASSESSMENT:   Other (Comment)    ASSESSMENT:   85 yo male admitted from [facility] for poor appetite, fatigue and SOB with acute respiratory failure with viral pneumonia due to COVID-19 infection, AKI on CKD. Note pt negative for Covid-19 on 09/28/18. PMH includes CKD III/IV, HTN, suspected underlying dementia with behavioral disorder   7/09 Rapid Response: desat to 66% on 7L HFNC after eating breakfast; ?choking episode  Noted SLP eval pending  Unable to obtain diet and weight history from patient  No recorded po intake  Current wt 54.4 kg; if weight is accurate, weight has trended down. Weight of 61.2 kg in May 2020. Appears to have weighed around 60 kg in 2019. >/= 10% wt loss in 2 months per weight encounters. Plan to request new weight   Labs: Creatinine 2.14, BUN 69 Meds: LR at 50 ml/hr, solumedrol, Vit C, Zinc sulfate  NUTRITION - FOCUSED PHYSICAL EXAM:  Unable to assess, working remotely  Diet Order:   Diet Order            DIET SOFT Room service appropriate? Yes; Fluid consistency: Thin  Diet effective now               EDUCATION NEEDS:   Not appropriate for education at this time  Skin:  Skin Assessment: Reviewed RN Assessment(no pressure injuries noted per RN skin assessment)  Last BM:  7/9  Height:   Ht Readings from Last 1 Encounters:  12/04/2018 5\' 6"  (1.676 m)    Weight:   Wt Readings from Last 1 Encounters:  11/25/2018 54.4 kg    Ideal Body Weight:  64.5 kg  BMI:  Body mass index is 19.37 kg/m.  Estimated Nutritional Needs:   Kcal:  1550-1650  Protein:  70-80 g  Fluid:  >/= 1.5 L   [employer] MS, RDN, LDN, CNSC [phone] Pager  [phone] Weekend/On-Call Pager

## 2018-12-21 NOTE — Progress Notes (Signed)
Wasted 80 ml of morphine with Anne Ng.

## 2018-12-21 NOTE — Progress Notes (Signed)
Informed Dr. Candiss Norse about increased work of breathing and vital signs. Orders put in for comfort care. Will continue to monitor.

## 2018-12-21 NOTE — Progress Notes (Signed)
SLP Cancellation Note  Patient Details Name: Lawrence Hicks MRN: [id number] DOB: 1923/12/25   Cancelled treatment:       Reason Eval/Treat Not Completed: Medical issues which prohibited therapy.  D/W RN, pt is approaching end-of-life.  SLP will respectfully sign off.   Juan Quam Laurice 26-Dec-2018, 2:14 PM

## 2018-12-21 NOTE — Plan of Care (Signed)
  Problem: Clinical Measurements: Goal: Will remain free from infection Outcome: Progressing Goal: Respiratory complications will improve Outcome: Progressing   Problem: Activity: Goal: Risk for activity intolerance will decrease Outcome: Progressing   Problem: Nutrition: Goal: Adequate nutrition will be maintained Outcome: Progressing   Problem: Elimination: Goal: Will not experience complications related to bowel motility Outcome: Progressing Goal: Will not experience complications related to urinary retention Outcome: Progressing   Problem: Pain Managment: Goal: General experience of comfort will improve Outcome: Progressing   Problem: Safety: Goal: Ability to remain free from injury will improve Outcome: Progressing   Problem: Skin Integrity: Goal: Risk for impaired skin integrity will decrease Outcome: Progressing

## 2018-12-21 NOTE — Progress Notes (Signed)
PT Cancellation/Discharge Note  Patient Details Name: Lawrence Hicks MRN: [id number] DOB: 01-Jan-1924   Cancelled Treatment:    Reason Eval/Treat Not Completed: Medical issues which prohibited therapy   Noted pt requiring increasing O2 with sats still very low. BP is 80/43. Receiving morphine. Patient not currently medically appropriate for PT. PT is signing off and please reorder if he becomes appropriate   [employer], PT December 16, 2018, 1:51 PM

## 2018-12-21 NOTE — Progress Notes (Signed)
Called Dr. Candiss Norse about patient's breathing, heart rate and general plan of care. Informed him that patient had increased work of breathing and was requiring more oxygen to maintain levels. Instructed to give 2 mg of morphine now and he would start more orders to help in plan of care. Will continue to monitor.

## 2018-12-21 NOTE — Progress Notes (Addendum)
PROGRESS NOTE                                                                                                                                                                                                             Lawrence Hicks Demographics:    Lawrence Hicks, is a 85 y.o. male, DOB - [birth year], BZJ:[id number]  Outpatient Primary MD for the Lawrence Hicks is Lawrence Hicks, No Pcp Per    LOS - 2  Admit date - 12/15/2018    Chief Complaint  Lawrence Hicks presents with  . Fatigue       Brief Narrative    85 y.o.malewith medical history significant forhypertension, LBBB, CKD stage III/IV, and suspected underlying dementia with behavioral disorder who presents to the ED from [place] for evaluation of poor appetite, fatigue, and shortness of breath.  Reported to have desaturation to high 80s per EMS.  He was placed on 6 L and weaned down to 2 L on arrival to ED. Of note Lawrence Hicks had a recently prolonged ED stay from 09/21/2018-10/11/2018 for confusion and memory loss prior to discharge to [place]. He had a SARS-CoV-2 test negative on 09/28/2018.  In ED, hemodynamically stable.  Saturation 94% on 4 L by nasal cannula.  Hgb 9.7 (about baseline) and lymphopenic.  Otherwise, CBC not impressive.  Creatinine 2.41 (about baseline).  Lactic acid 3.1> 2.5.  EKG rate controlled A. fib with severe LAD and some PVCs which is basically unchanged from baseline.  Troponin flat at 552.  COVID-19 positive.  Portable CXR with patchy bibasilar infiltrates.  Inflammatory markers for COVID-19 elevated.  Lawrence Hicks was started on Solu-Medrol and Remdesivir & was admitted to [facility] for possible cardiac evaluation by cardiology.   Lawrence Hicks was discussed with cardiology, Dr. Percival Spanish and he did not recommend further cardiac work-up.   Subjective:    [name] today appears weak-tiered and unable to answer questions   Assessment  & Plan :   Addendum at  10:40 AM was called by Lawrence Hicks's nurse that he is now bradycardic and gasping for air.  PRN morphine initiated if further decline for morphine drip.  Goal will be to keep him comfortable.   1. Acute Hypoxic Resp. Failure due to Acute Covid 19 Viral Pneumonitis during the ongoing 2020 Covid 19 Pandemic - he has been started on steroids and Remdisvir, still  very hypoxic and appears more tired and fatigued, discussed in detail with Lawrence Hicks's POA Lawrence Hicks on December 01, 2018, plan is to continue medical treatment but if he declines further or if he is suffering at any point we will transition to full comfort measures.  We will continue to monitor inflammatory markers which are still quite elevated.  Prognosis remains extremely poor.    COVID-19 Labs  Recent Labs    12/12/2018 2143 11/28/18 0442 12/01/18 0350  DDIMER 2.95* 2.41* 1.67*  FERRITIN 618* 617* 1,060*  LDH 282*  --  349*  CRP 16.5* 17.3* 21.3*    Lab Results  Component Value Date   SARSCOV2NAA POSITIVE (A) 12/19/2018   SARSCOV2NAA NEGATIVE 09/28/2018   SARSCOV2NAA NEGATIVE 09/22/2018     Hepatic Function Latest Ref Rng & Units 01-Dec-2018 11/28/2018 11/30/2018  Total Protein 6.5 - 8.1 g/dL 5.9(L) 5.7(L) 6.0(L)  Albumin 3.5 - 5.0 g/dL 2.7(L) 2.7(L) 3.0(L)  AST 15 - 41 U/L 41 42(H) 45(H)  ALT 0 - 44 U/L 30 32 32  Alk Phosphatase 38 - 126 U/L 74 78 90  Total Bilirubin 0.3 - 1.2 mg/dL 0.5 0.9 0.9  Bilirubin, Direct 0.00 - 0.40 mg/dL - - -        Component Value Date/Time   BNP 129.3 (H) 12/01/2018 0350      2.  Toxic encephalopathy on top of underlying dementia.  Supportive care.  Minimize narcotics and benzodiazepines if possible.  3.  Mildly elevated troponin and flat trend not an ACS pattern.  Case was discussed by previous MD with the cardiology team Dr. Percival Spanish, not a candidate for invasive measures, supportive care with aspirin and statin as tolerated, BP too low for beta-blockers.  This is likely demand ischemia from  hypoxia.  4.  Hypertension.  Blood pressure is soft off of any blood pressure medications.  Monitor.  Gently hydrate.  5.  Urinary retention.  Required urology to place Foley catheter at [facility] this admission.  Continue.  Placed on Flomax.  6.  Permanent A. fib.  Not on anticoagulation due to fall risk, goal will be rate control, currently blood pressure too low for beta-blocker.  Mali vas 2 score will be at least 3.  7.  CKD 4.  Baseline creatinine 2.4.  Continue to monitor with gentle hydration.  8.  Elevated lactic acid due to dehydration.  Hydrate.   Condition - Extremely Guarded  Family Communication  :  POA [name] on 2018-12-01, comfort if declines, DNR  Code Status : DNR  Diet :    Diet Order            DIET SOFT Room service appropriate? Yes; Fluid consistency: Thin  Diet effective now               Disposition Plan  :  Med  Consults  :  NONE  Procedures  :     PUD Prophylaxis :    DVT Prophylaxis  : Heparin    Lab Results  Component Value Date   PLT 194 12-01-18    Inpatient Medications  Scheduled Meds: . albuterol  2 puff Inhalation Q6H  . aspirin  81 mg Oral Daily  . atorvastatin  10 mg Oral q1800  . heparin  5,000 Units Subcutaneous Q8H  . lidocaine  1 application Urethral Once  . methylPREDNISolone (SOLU-MEDROL) injection  40 mg Intravenous Q12H  . risperiDONE  1 mg Oral Daily  . sodium chloride flush  3 mL Intravenous Q12H  .  valproic acid  250 mg Oral BID  . vitamin C  500 mg Oral Daily  . zinc sulfate  220 mg Oral Daily   Continuous Infusions: . remdesivir 100 mg in NS 250 mL 100 mg (11/28/18 2239)   PRN Meds:.acetaminophen, chlorpheniramine-HYDROcodone, guaiFENesin-dextromethorphan  Antibiotics  :    Anti-infectives (From admission, onward)   Start     Dose/Rate Route Frequency Ordered Stop   11/28/18 2330  remdesivir 100 mg in sodium chloride 0.9 % 250 mL IVPB     100 mg 500 mL/hr over 30 Minutes Intravenous Every  24 hours 12/19/2018 2318     12/11/2018 2315  remdesivir 200 mg in sodium chloride 0.9 % 250 mL IVPB     200 mg 500 mL/hr over 30 Minutes Intravenous Once 12/15/2018 2314 11/28/18 0700       Time Spent in minutes  30   [name] M.D on 12/07/18 at 8:58 AM  To page go to [email or web address] - password [facility]  [facility] -  Office  [phone]   See all Orders from today for further details    Objective:   Vitals:   11/28/18 0700 11/28/18 2030 2018-12-07 0000 Dec 07, 2018 0400  BP: 117/86 97/70 (!) 111/52 100/75  Pulse: 65 (!) 27 67 68  Resp: 18     Temp:  98 F (36.7 C)  97.9 F (36.6 C)  TempSrc:  Axillary  Axillary  SpO2: 92% 94% (!) 81% 92%  Weight:      Height:        Wt Readings from Last 3 Encounters:  12/10/2018 54.4 kg  09/21/18 61.2 kg  01/15/18 59.1 kg     Intake/Output Summary (Last 24 hours) at 12-07-2018 0858 Last data filed at December 07, 2018 0400 Gross per 24 hour  Intake 86.83 ml  Output 2200 ml  Net -2113.17 ml     Physical Exam  Awake but confised, appears very fatigued [place].AT,PERRAL Supple Neck,No JVD, No cervical lymphadenopathy appriciated.  Symmetrical Chest wall movement, Good air movement bilaterally, Coarse B. sounds RRR,No Gallops,Rubs or new Murmurs, No Parasternal Heave +ve B.Sounds, Abd Soft, No tenderness, No organomegaly appriciated, No rebound - guarding or rigidity. No Cyanosis, Clubbing or edema, No new Rash or bruise       Data Review:    CBC Recent Labs  Lab 12/09/2018 1600 11/28/18 0442 Dec 07, 2018 0350  WBC 6.2 5.4 11.6*  HGB 9.7* 10.0* 10.0*  HCT 29.3* 29.5* 31.0*  PLT 179 173 194  MCV 94.8 92.8 95.4  MCH 31.4 31.4 30.8  MCHC 33.1 33.9 32.3  RDW 14.0 13.8 14.2  LYMPHSABS  --  0.2* 0.2*  MONOABS  --  0.2 0.3  EOSABS  --  0.0 0.0  BASOSABS  --  0.0 0.0    Chemistries  Recent Labs  Lab 11/24/2018 1600 11/28/18 0442 December 07, 2018 0350  NA 138 139 137  K 3.9 3.8 3.7  CL 105 105 102  CO2 20* 21* 20*  GLUCOSE 132* 153*  125*  BUN 58* 51* 69*  CREATININE 2.41* 2.03* 2.14*  CALCIUM 8.0* 7.8* 7.9*  MG  --  1.7 2.1  AST 45* 42* 41  ALT 32 32 30  ALKPHOS 90 78 74  BILITOT 0.9 0.9 0.5   ------------------------------------------------------------------------------------------------------------------ No results for input(s): CHOL, HDL, LDLCALC, TRIG, CHOLHDL, LDLDIRECT in the last 72 hours.  No results found for: HGBA1C ------------------------------------------------------------------------------------------------------------------ No results for input(s): TSH, T4TOTAL, T3FREE, THYROIDAB in the last 72 hours.  Invalid input(s): FREET3  Cardiac  Enzymes No results for input(s): CKMB, TROPONINI, MYOGLOBIN in the last 168 hours.  Invalid input(s): CK ------------------------------------------------------------------------------------------------------------------    Component Value Date/Time   BNP 129.3 (H) 2018/12/28 0350    Micro Results Recent Results (from the past 240 hour(s))  Blood Culture (routine x 2)     Status: None (Preliminary result)   Collection Time: 12/13/2018  4:00 PM   Specimen: BLOOD RIGHT ARM  Result Value Ref Range Status   Specimen Description BLOOD RIGHT ARM  Final   Special Requests   Final    BOTTLES DRAWN AEROBIC AND ANAEROBIC Blood Culture results may not be optimal due to an inadequate volume of blood received in culture bottles   Culture   Final    NO GROWTH < 24 HOURS Performed at [facility], [place] [address] [place], [place] [address]    Report Status PENDING  Incomplete  SARS Coronavirus 2 (CEPHEID- Performed in [place] hospital lab), Hosp Order     Status: Abnormal   Collection Time: 11/30/2018  4:00 PM   Specimen: Nasopharyngeal Swab  Result Value Ref Range Status   SARS Coronavirus 2 POSITIVE (A) NEGATIVE Final    Comment: RESULT CALLED TO, READ BACK BY AND VERIFIED WITH: K CARTER RN 12/02/2018 1745 JDW (NOTE) If result is NEGATIVE SARS-CoV-2 target  nucleic acids are NOT DETECTED. The SARS-CoV-2 RNA is generally detectable in upper and lower  respiratory specimens during the acute phase of infection. The lowest  concentration of SARS-CoV-2 viral copies this assay can detect is 250  copies / mL. A negative result does not preclude SARS-CoV-2 infection  and should not be used as the sole basis for treatment or other  Lawrence Hicks management decisions.  A negative result may occur with  improper specimen collection / handling, submission of specimen other  than nasopharyngeal swab, presence of viral mutation(s) within the  areas targeted by this assay, and inadequate number of viral copies  (<250 copies / mL). A negative result must be combined with clinical  observations, Lawrence Hicks history, and epidemiological information. If result is POSITIVE SARS-CoV-2 target nucleic acids are DETECTED. The SARS- CoV-2 RNA is generally detectable in upper and lower  respiratory specimens during the acute phase of infection.  Positive  results are indicative of active infection with SARS-CoV-2.  Clinical  correlation with Lawrence Hicks history and other diagnostic information is  necessary to determine Lawrence Hicks infection status.  Positive results do  not rule out bacterial infection or co-infection with other viruses. If result is PRESUMPTIVE POSTIVE SARS-CoV-2 nucleic acids MAY BE PRESENT.   A presumptive positive result was obtained on the submitted specimen  and confirmed on repeat testing.  While 2019 novel coronavirus  (SARS-CoV-2) nucleic acids may be present in the submitted sample  additional confirmatory testing may be necessary for epidemiological  and / or clinical management purposes  to differentiate between  SARS-CoV-2 and other Sarbecovirus currently known to infect humans.  If clinically indicated additional testing with an alternate test  methodology [phone]) is advised . The SARS-CoV-2 RNA is generally  detectable in upper and lower  respiratory specimens during the acute  phase of infection. The expected result is Negative. Fact Sheet for Patients:  StrictlyIdeas.no Fact Sheet for Healthcare Providers: BankingDealers.co.za This test is not yet approved or cleared by the [place] FDA and has been authorized for detection and/or diagnosis of SARS-CoV-2 by FDA under an Emergency Use Authorization (EUA).  This EUA will remain in effect (meaning this test  can be used) for the duration of the COVID-19 declaration under Section 564(b)(1) of the Act, 21 U.S.C. section 360bbb-3(b)(1), unless the authorization is terminated or revoked sooner. Performed at [facility], [place] [address], [place], [place] [address]   Urine culture     Status: None   Collection Time: 11/26/2018  4:45 PM   Specimen: In/Out Cath Urine  Result Value Ref Range Status   Specimen Description IN/OUT CATH URINE  Final   Special Requests NONE  Final   Culture   Final    NO GROWTH Performed at [facility], [place] [address], [place], [place] [address]    Report Status 11/28/2018 FINAL  Final  Blood Culture (routine x 2)     Status: None (Preliminary result)   Collection Time: 12/02/2018  4:47 PM   Specimen: BLOOD LEFT ARM  Result Value Ref Range Status   Specimen Description BLOOD LEFT ARM  Final   Special Requests   Final    BOTTLES DRAWN AEROBIC AND ANAEROBIC Blood Culture adequate volume   Culture   Final    NO GROWTH < 24 HOURS Performed at [facility], [place] [address], [place], [place] [address]    Report Status PENDING  Incomplete  MRSA PCR Screening     Status: None   Collection Time: 12/08/2018  9:42 PM   Specimen: Nasopharyngeal  Result Value Ref Range Status   MRSA by PCR NEGATIVE NEGATIVE Final    Comment:        The GeneXpert MRSA Assay (FDA approved for NASAL specimens only), is one component of a comprehensive MRSA colonization surveillance program. It is not  intended to diagnose MRSA infection nor to guide or monitor treatment for MRSA infections. Performed at [facility], [place] [address] [place],  [address]     Radiology Reports Dg Chest Port 1 View  Result Date: 12/12/2018 CLINICAL DATA:  Pneumonia EXAM: PORTABLE CHEST 1 VIEW COMPARISON:  Portable exam at 1646 hrs compared to 09/21/2018 FINDINGS: Kyphotic positioning. Upper normal heart size. Mediastinal contours and pulmonary vascularity normal. Patchy infiltrates in the mid to lower lungs bilaterally question pneumonia. Component of atelectasis at LEFT base is also present. No pleural effusion or pneumothorax. Extensive degenerative changes of the LEFT glenohumeral joint with osseous demineralization noted. IMPRESSION: Patchy bibasilar infiltrates consistent with pneumonia. LEFT basilar atelectasis. Electronically Signed   By: Lavonia Dana M.D.   On: 11/24/2018 17:23

## 2018-12-21 NOTE — Death Summary Note (Signed)
[facility] Death Note                                                                                                                                                                                               Lawrence Hicks, is a 85 y.o. male, DOB - 03-23-1924, QPR:[id number]  Admit date - 11/20/2018   Admitting Physician Lenore Cordia, MD  Outpatient Primary MD for the patient is Patient, No Pcp Per  LOS - 2  Chief Complaint  Patient presents with  . Fatigue       Notification: Patient, No Pcp Per notified of death of Dec 28, 2018   Date and Time of Death - 28-Dec-2018 @ 18:40  Pronounced by - RN  History of present illness:   Lawrence Hicks is a 85 y.o. male with a history of - 85 y.o.malewith medical history significant forhypertension, LBBB, CKD stage III/IV, and suspected underlying dementia with behavioral disorder who presents to the ED from [place] for evaluation of poor appetite, fatigue, and shortness of breath.  Reported to have desaturation to high 80s per EMS, he was diagnosed with Covid 19 Pneumonitis causing hypoxia, he was initially started on steroids along with REMDESIVIR but he continued to decline, he was subsequently transitioned to comfort measures and he passed away peacefully on 12-28-18 1840 p.m.   Final Diagnoses:  Cause if death - Pneumonia, Covid 75  Signature  [name] M.D on 2018/12/28 at 6:45 PM  [facility]  Office Phone [phone]  Total clinical and documentation time for today Under 30 minutes   Last Note                                                                      PROGRESS NOTE  Patient Demographics:    Lawrence Hicks, is a 85 y.o. male, DOB - 05-22-1924, TDS:[id number]  Outpatient Primary MD for the patient is Patient, No Pcp Per    LOS - 2  Admit date - 11/26/2018    Chief Complaint  Patient presents with  . Fatigue       Brief Narrative    85 y.o.malewith medical history significant forhypertension, LBBB, CKD stage III/IV, and suspected underlying dementia with behavioral disorder who presents to the ED from [facility] for evaluation of poor appetite, fatigue, and shortness of breath.  Reported to have desaturation to high 80s per EMS.  He was placed on 6 L and weaned down to 2 L on arrival to ED. Of note patient had a recently prolonged ED stay from 09/21/2018-10/11/2018 for confusion and memory loss prior to discharge to [facility]. He had a SARS-CoV-2 test negative on 09/28/2018.  In ED, hemodynamically stable.  Saturation 94% on 4 L by nasal cannula.  Hgb 9.7 (about baseline) and lymphopenic.  Otherwise, CBC not impressive.  Creatinine 2.41 (about baseline).  Lactic acid 3.1> 2.5.  EKG rate controlled A. fib with severe LAD and some PVCs which is basically unchanged from baseline.  Troponin flat at 552.  COVID-19 positive.  Portable CXR with patchy bibasilar infiltrates.  Inflammatory markers for COVID-19 elevated.  Patient was started on Solu-Medrol and Remdesivir & was admitted to [facility] for possible cardiac evaluation by cardiology.   Patient was discussed with cardiology, Dr. Percival Spanish and he did not recommend further cardiac work-up.   Subjective:    Lawrence Hicks today appears weak-tiered and unable to answer questions   Assessment  & Plan :   Addendum at 10:40 AM was called by patient's nurse that he is now bradycardic and gasping for air.  PRN morphine initiated if further decline for morphine drip.  Goal will be to keep him comfortable.   1. Acute Hypoxic Resp. Failure due to Acute Covid 19 Viral Pneumonitis during the ongoing 2020 Covid 19 Pandemic - he has been  started on steroids and Remdisvir, still very hypoxic and appears more tired and fatigued, discussed in detail with patient's POA Ms. [name] on 2018/12/24, plan is to continue medical treatment but if he declines further or if he is suffering at any point we will transition to full comfort measures.  We will continue to monitor inflammatory markers which are still quite elevated.  Prognosis remains extremely poor.    COVID-19 Labs  Recent Labs    12/19/2018 2143 11/28/18 0442 Dec 24, 2018 0350  DDIMER 2.95* 2.41* 1.67*  FERRITIN 618* 617* 1,060*  LDH 282*  --  349*  CRP 16.5* 17.3* 21.3*    Lab Results  Component Value Date   SARSCOV2NAA POSITIVE (A) 12/08/2018   SARSCOV2NAA NEGATIVE 09/28/2018   SARSCOV2NAA NEGATIVE 09/22/2018     Hepatic Function Latest Ref Rng & Units 12/24/2018 11/28/2018 11/28/2018  Total Protein 6.5 - 8.1 g/dL 5.9(L) 5.7(L) 6.0(L)  Albumin 3.5 - 5.0 g/dL 2.7(L) 2.7(L) 3.0(L)  AST 15 - 41 U/L 41 42(H) 45(H)  ALT 0 - 44 U/L 30 32 32  Alk Phosphatase 38 - 126 U/L 74 78 90  Total Bilirubin 0.3 - 1.2 mg/dL 0.5 0.9 0.9  Bilirubin, Direct 0.00 - 0.40 mg/dL - - -        Component Value Date/Time   BNP 129.3 (H) 12/24/2018 0350      2.  Toxic encephalopathy on top of underlying dementia.  Supportive care.  Minimize narcotics and benzodiazepines if possible.  3.  Mildly elevated troponin and flat trend not an ACS pattern.  Case was discussed by previous MD with the cardiology team Dr. Percival Spanish, not a candidate for invasive measures, supportive care with aspirin and statin as tolerated, BP too low for beta-blockers.  This is likely demand ischemia from hypoxia.  4.  Hypertension.  Blood pressure is soft off of any blood pressure medications.  Monitor.  Gently hydrate.  5.  Urinary retention.  Required urology to place Foley catheter at [facility] this admission.  Continue.  Placed on Flomax.  6.  Permanent A. fib.  Not on anticoagulation due to fall risk,  goal will be rate control, currently blood pressure too low for beta-blocker.  Mali vas 2 score will be at least 3.  7.  CKD 4.  Baseline creatinine 2.4.  Continue to monitor with gentle hydration.  8.  Elevated lactic acid due to dehydration.  Hydrate.   Condition - Extremely Guarded  Family Communication  :  POA [name] on 12/27/18, comfort if declines, DNR  Code Status : DNR  Diet :    Diet Order            DIET DYS 3 Room service appropriate? Yes; Fluid consistency: Thin  Diet effective now               Disposition Plan  :  Med  Consults  :  NONE  Procedures  :     PUD Prophylaxis :    DVT Prophylaxis  : Heparin    Lab Results  Component Value Date   PLT 194 12/27/2018    Inpatient Medications  Scheduled Meds: . albuterol  2 puff Inhalation Q6H  . lidocaine  1 application Urethral Once  . methylPREDNISolone (SOLU-MEDROL) injection  40 mg Intravenous Q12H  . sodium chloride flush  3 mL Intravenous Q12H  . valproic acid  250 mg Oral BID   Continuous Infusions: . morphine 1 mg/hr (12-27-2018 1608)   PRN Meds:.acetaminophen, antiseptic oral rinse, bisacodyl, glycopyrrolate **OR** glycopyrrolate **OR** glycopyrrolate, guaiFENesin-dextromethorphan, LORazepam **OR** LORazepam **OR** LORazepam, magic mouthwash, polyvinyl alcohol  Antibiotics  :    Anti-infectives (From admission, onward)   Start     Dose/Rate Route Frequency Ordered Stop   11/28/18 2330  remdesivir 100 mg in sodium chloride 0.9 % 250 mL IVPB  Status:  Discontinued     100 mg 500 mL/hr over 30 Minutes Intravenous Every 24 hours 12/17/2018 2318 2018-12-27 1603   12/03/2018 2315  remdesivir 200 mg in sodium chloride 0.9 % 250 mL IVPB     200 mg 500 mL/hr over 30 Minutes Intravenous Once 12/06/2018 2314 11/28/18 0700       Time Spent in minutes  30   [name] M.D on Dec 27, 2018 at 6:46 PM  To page go to [email or web address] - password [facility] [facility]  [facility] -  Office  [phone]   See all  Orders from today for further details    Objective:   Vitals:   12-27-2018 1200 12/27/18 1300 12-27-18 1400 12-27-18 1800  BP: (!) 80/43     Pulse: 61 (!) 101 71   Resp:      Temp:      TempSrc:      SpO2: (!) 88% 91% (!) 62%   Weight:    54.4 kg  Height:    '5\' 6"'  (1.676 m)    Wt Readings from Last 3 Encounters:  December 29, 2018 54.4 kg  09/21/18 61.2 kg  01/15/18 59.1 kg     Intake/Output Summary (Last 24 hours) at 12/29/18 1846 Last data filed at Dec 29, 2018 1400 Gross per 24 hour  Intake 431.81 ml  Output 900 ml  Net -468.19 ml     Physical Exam  Awake but confised, appears very fatigued Delta.AT,PERRAL Supple Neck,No JVD, No cervical lymphadenopathy appriciated.  Symmetrical Chest wall movement, Good air movement bilaterally, Coarse B. sounds RRR,No Gallops,Rubs or new Murmurs, No Parasternal Heave +ve B.Sounds, Abd Soft, No tenderness, No organomegaly appriciated, No rebound - guarding or rigidity. No Cyanosis, Clubbing or edema, No new Rash or bruise       Data Review:    CBC Recent Labs  Lab 12/01/2018 1600 11/28/18 0442 29-Dec-2018 0350  WBC 6.2 5.4 11.6*  HGB 9.7* 10.0* 10.0*  HCT 29.3* 29.5* 31.0*  PLT 179 173 194  MCV 94.8 92.8 95.4  MCH 31.4 31.4 30.8  MCHC 33.1 33.9 32.3  RDW 14.0 13.8 14.2  LYMPHSABS  --  0.2* 0.2*  MONOABS  --  0.2 0.3  EOSABS  --  0.0 0.0  BASOSABS  --  0.0 0.0    Chemistries  Recent Labs  Lab 11/23/2018 1600 11/28/18 0442 12/29/18 0350  NA 138 139 137  K 3.9 3.8 3.7  CL 105 105 102  CO2 20* 21* 20*  GLUCOSE 132* 153* 125*  BUN 58* 51* 69*  CREATININE 2.41* 2.03* 2.14*  CALCIUM 8.0* 7.8* 7.9*  MG  --  1.7 2.1  AST 45* 42* 41  ALT 32 32 30  ALKPHOS 90 78 74  BILITOT 0.9 0.9 0.5   ------------------------------------------------------------------------------------------------------------------ No results for input(s): CHOL, HDL, LDLCALC, TRIG, CHOLHDL, LDLDIRECT in the last 72 hours.  No results found for: HGBA1C  ------------------------------------------------------------------------------------------------------------------ No results for input(s): TSH, T4TOTAL, T3FREE, THYROIDAB in the last 72 hours.  Invalid input(s): FREET3  Cardiac Enzymes No results for input(s): CKMB, TROPONINI, MYOGLOBIN in the last 168 hours.  Invalid input(s): CK ------------------------------------------------------------------------------------------------------------------    Component Value Date/Time   BNP 129.3 (H) 12-29-18 0350    Micro Results Recent Results (from the past 240 hour(s))  Blood Culture (routine x 2)     Status: None (Preliminary result)   Collection Time: 12/10/2018  4:00 PM   Specimen: BLOOD RIGHT ARM  Result Value Ref Range Status   Specimen Description BLOOD RIGHT ARM  Final   Special Requests   Final    BOTTLES DRAWN AEROBIC AND ANAEROBIC Blood Culture results may not be optimal due to an inadequate volume of blood received in culture bottles   Culture   Final    NO GROWTH 2 DAYS Performed at [facility], [place] [address], [place], [place] [address]    Report Status PENDING  Incomplete  SARS Coronavirus 2 (CEPHEID- Performed in [facility] hospital lab), Hosp Order     Status: Abnormal   Collection Time: 12/12/2018  4:00 PM   Specimen: Nasopharyngeal Swab  Result Value Ref Range Status   SARS Coronavirus 2 POSITIVE (A) NEGATIVE Final    Comment: RESULT CALLED TO, READ BACK BY AND VERIFIED WITH: K CARTER RN 12/09/2018 1745 JDW (NOTE) If result is NEGATIVE SARS-CoV-2 target nucleic acids are NOT DETECTED. The SARS-CoV-2 RNA is generally detectable in upper and lower  respiratory specimens during the acute phase of infection. The lowest  concentration of SARS-CoV-2 viral copies this assay can detect is 250  copies / mL. A negative result does  not preclude SARS-CoV-2 infection  and should not be used as the sole basis for treatment or other  patient management decisions.  A negative  result may occur with  improper specimen collection / handling, submission of specimen other  than nasopharyngeal swab, presence of viral mutation(s) within the  areas targeted by this assay, and inadequate number of viral copies  (<250 copies / mL). A negative result Hicks be combined with clinical  observations, patient history, and epidemiological information. If result is POSITIVE SARS-CoV-2 target nucleic acids are DETECTED. The SARS- CoV-2 RNA is generally detectable in upper and lower  respiratory specimens during the acute phase of infection.  Positive  results are indicative of active infection with SARS-CoV-2.  Clinical  correlation with patient history and other diagnostic information is  necessary to determine patient infection status.  Positive results do  not rule out bacterial infection or co-infection with other viruses. If result is PRESUMPTIVE POSTIVE SARS-CoV-2 nucleic acids MAY BE PRESENT.   A presumptive positive result was obtained on the submitted specimen  and confirmed on repeat testing.  While 2019 novel coronavirus  (SARS-CoV-2) nucleic acids may be present in the submitted sample  additional confirmatory testing may be necessary for epidemiological  and / or clinical management purposes  to differentiate between  SARS-CoV-2 and other Sarbecovirus currently known to infect humans.  If clinically indicated additional testing with an alternate test  methodology [phone]) is advised . The SARS-CoV-2 RNA is generally  detectable in upper and lower respiratory specimens during the acute  phase of infection. The expected result is Negative. Fact Sheet for Patients:  StrictlyIdeas.no Fact Sheet for Healthcare Providers: BankingDealers.co.za This test is not yet approved or cleared by the [place] FDA and has been authorized for detection and/or diagnosis of SARS-CoV-2 by FDA under an Emergency Use Authorization  (EUA).  This EUA will remain in effect (meaning this test can be used) for the duration of the COVID-19 declaration under Section 564(b)(1) of the Act, 21 U.S.C. section 360bbb-3(b)(1), unless the authorization is terminated or revoked sooner. Performed at [facility], [place] [address], [place], [place] [address]   Urine culture     Status: None   Collection Time: 12/12/2018  4:45 PM   Specimen: In/Out Cath Urine  Result Value Ref Range Status   Specimen Description IN/OUT CATH URINE  Final   Special Requests NONE  Final   Culture   Final    NO GROWTH Performed at [facility], [place] [address] [place], [place] [address]    Report Status 11/28/2018 FINAL  Final  Blood Culture (routine x 2)     Status: None (Preliminary result)   Collection Time: 12/16/2018  4:47 PM   Specimen: BLOOD LEFT ARM  Result Value Ref Range Status   Specimen Description BLOOD LEFT ARM  Final   Special Requests   Final    BOTTLES DRAWN AEROBIC AND ANAEROBIC Blood Culture adequate volume   Culture   Final    NO GROWTH 2 DAYS Performed at [facility], [place] [address] [place], [place] [address]    Report Status PENDING  Incomplete  MRSA PCR Screening     Status: None   Collection Time: 12/13/2018  9:42 PM   Specimen: Nasopharyngeal  Result Value Ref Range Status   MRSA by PCR NEGATIVE NEGATIVE Final    Comment:        The GeneXpert MRSA Assay (FDA approved for NASAL specimens only),  is one component of a comprehensive MRSA colonization surveillance program. It is not intended to diagnose MRSA infection nor to guide or monitor treatment for MRSA infections. Performed at [facility], [place] [address], [place], [place] [address]     Radiology Reports Dg Chest Port 1 View  Result Date: 11/21/2018 CLINICAL DATA:  Pneumonia EXAM: PORTABLE CHEST 1 VIEW COMPARISON:  Portable exam at 1646 hrs compared to 09/21/2018 FINDINGS: Kyphotic positioning. Upper normal heart size. Mediastinal  contours and pulmonary vascularity normal. Patchy infiltrates in the mid to lower lungs bilaterally question pneumonia. Component of atelectasis at LEFT base is also present. No pleural effusion or pneumothorax. Extensive degenerative changes of the LEFT glenohumeral joint with osseous demineralization noted. IMPRESSION: Patchy bibasilar infiltrates consistent with pneumonia. LEFT basilar atelectasis. Electronically Signed   By: Lavonia Dana M.D.   On: 11/30/2018 17:23

## 2018-12-21 DEATH — deceased

## 2019-07-29 IMAGING — DX PORTABLE CHEST - 1 VIEW
1 series · 1 of 1 positions shown · non-contrast
Comparison: Portable exam at 7868 hours compared to 09/25/2016

CLINICAL DATA: Altered mental status

EXAM:
PORTABLE CHEST 1 VIEW

[chest ap]
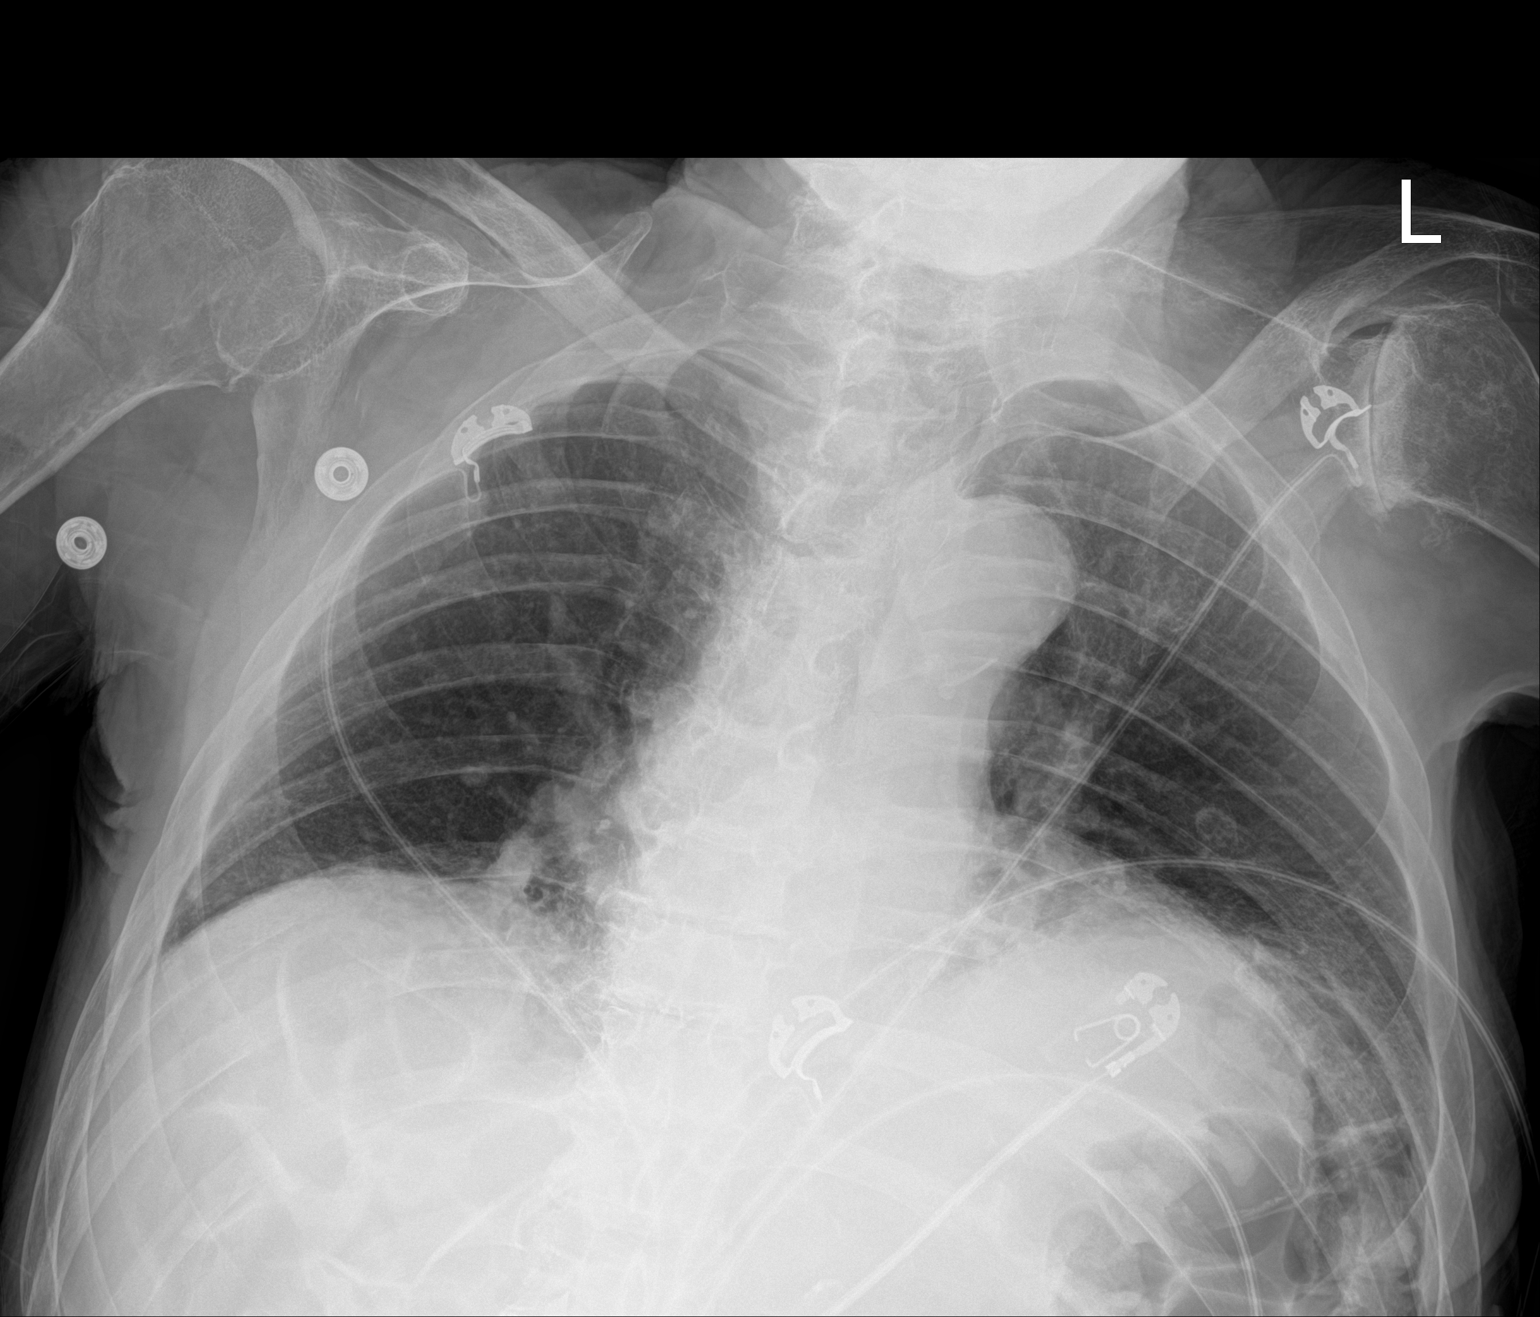

[1 of 1 positions shown; findings below may reference images not displayed]

FINDINGS: Rotated to the LEFT.

Normal heart size, mediastinal contours, and pulmonary vascularity.

Atherosclerotic calcification aorta.

Low lung volumes with bibasilar atelectasis.

Lungs otherwise clear.

No infiltrate, pleural effusion or pneumothorax.

Bones demineralized with advanced LEFT glenohumeral degenerative
changes.
IMPRESSION: Low lung volumes with bibasilar atelectasis.

## 2019-10-04 IMAGING — DX PORTABLE CHEST - 1 VIEW
1 series · 1 of 1 positions shown · non-contrast
Comparison: Portable exam at 2808 hrs compared to 09/21/2018

CLINICAL DATA: Pneumonia

EXAM:
PORTABLE CHEST 1 VIEW

[chest]
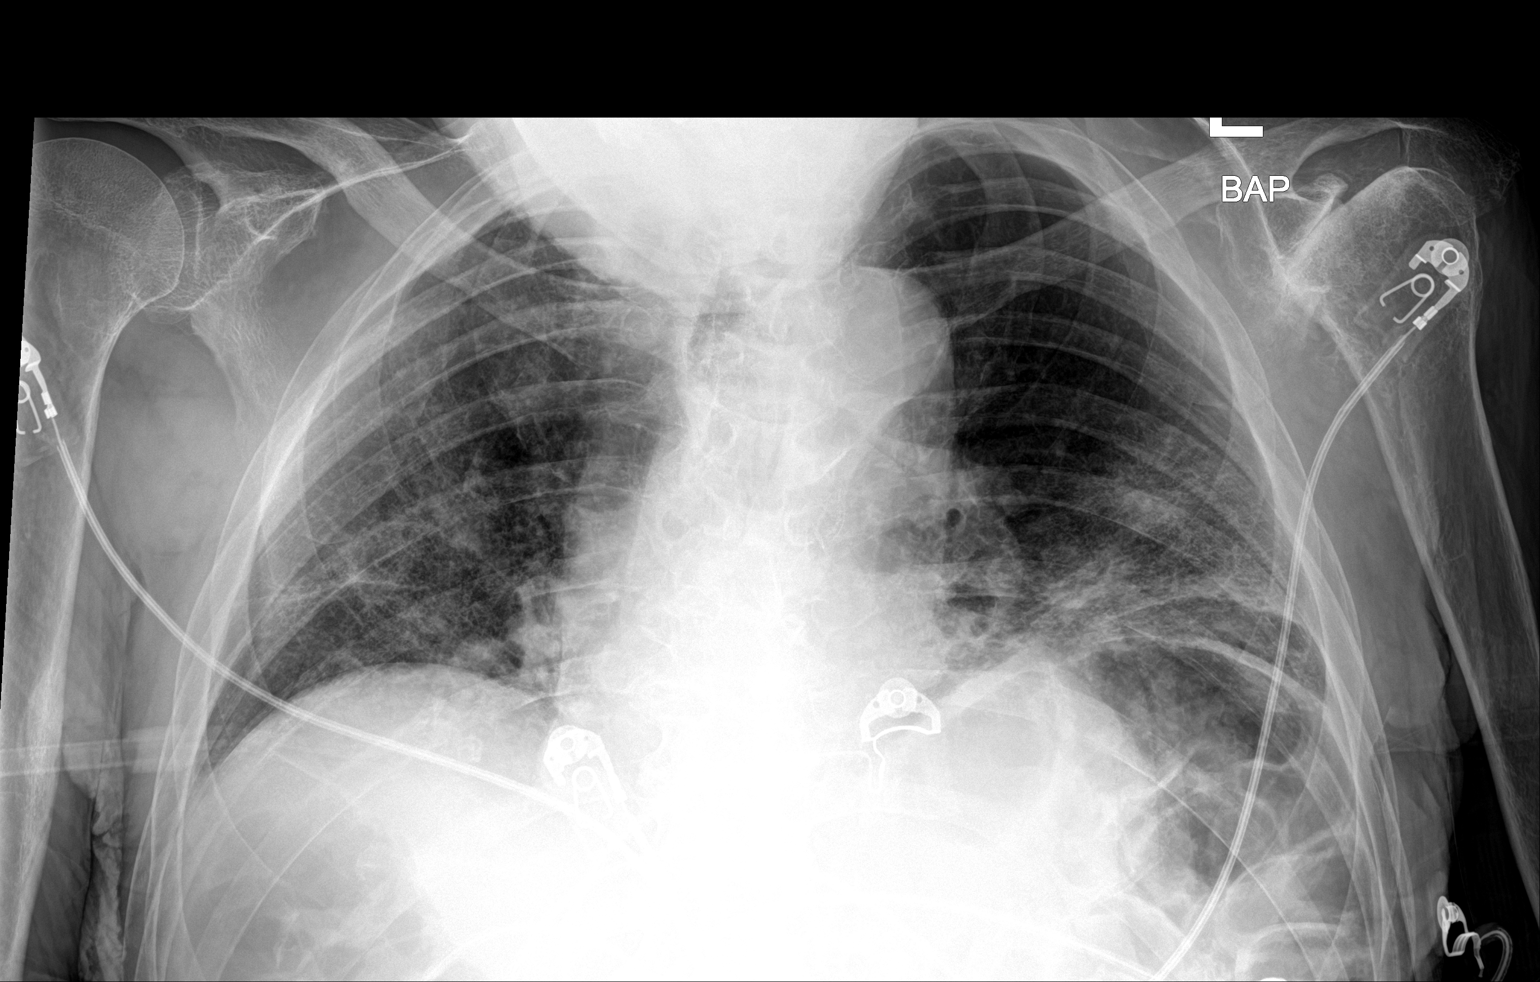

[1 of 1 positions shown; findings below may reference images not displayed]

FINDINGS: Kyphotic positioning.

Upper normal heart size.

Mediastinal contours and pulmonary vascularity normal.

Patchy infiltrates in the mid to lower lungs bilaterally question
pneumonia.

Component of atelectasis at LEFT base is also present.

No pleural effusion or pneumothorax.

Extensive degenerative changes of the LEFT glenohumeral joint with
osseous demineralization noted.
IMPRESSION: Patchy bibasilar infiltrates consistent with pneumonia.

LEFT basilar atelectasis.
